# Patient Record
Sex: Male | Born: 1995 | Race: White | Hispanic: No | Marital: Single | Smoking: Former smoker
Health system: Southern US, Community
[De-identification: ages and names within clinical notes are randomized; demographics above are authoritative.]

## PROBLEM LIST (undated history)

## (undated) DIAGNOSIS — E119 Type 2 diabetes mellitus without complications: Secondary | ICD-10-CM

---
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
## 2019-04-15 ENCOUNTER — Encounter (HOSPITAL_BASED_OUTPATIENT_CLINIC_OR_DEPARTMENT_OTHER): Payer: Self-pay | Admitting: Emergency Medicine

## 2019-04-15 ENCOUNTER — Emergency Department (HOSPITAL_BASED_OUTPATIENT_CLINIC_OR_DEPARTMENT_OTHER)
Admission: EM | Admit: 2019-04-15 | Discharge: 2019-04-15 | Disposition: A | Discharge status: Home / Self Care | Payer: Self-pay | Attending: Emergency Medicine | Admitting: Emergency Medicine

## 2019-04-15 ENCOUNTER — Other Ambulatory Visit: Payer: Self-pay

## 2019-04-15 DIAGNOSIS — Z87891 Personal history of nicotine dependence: Secondary | ICD-10-CM | POA: Insufficient documentation

## 2019-04-15 DIAGNOSIS — F1210 Cannabis abuse, uncomplicated: Secondary | ICD-10-CM | POA: Insufficient documentation

## 2019-04-15 DIAGNOSIS — E119 Type 2 diabetes mellitus without complications: Secondary | ICD-10-CM | POA: Insufficient documentation

## 2019-04-15 DIAGNOSIS — K3184 Gastroparesis: Secondary | ICD-10-CM | POA: Insufficient documentation

## 2019-04-15 HISTORY — DX: Type 2 diabetes mellitus without complications: E11.9

## 2019-04-15 LAB — COMPREHENSIVE METABOLIC PANEL
ALT: 17 U/L (ref 0–44)
AST: 26 U/L (ref 15–41)
Albumin: 4.8 g/dL (ref 3.5–5.0)
Alkaline Phosphatase: 79 U/L (ref 38–126)
Anion gap: 13 (ref 5–15)
BUN: 12 mg/dL (ref 6–20)
CO2: 26 mmol/L (ref 22–32)
Calcium: 9.6 mg/dL (ref 8.9–10.3)
Chloride: 98 mmol/L (ref 98–111)
Creatinine, Ser: 0.74 mg/dL (ref 0.61–1.24)
GFR calc Af Amer: >60 mL/min (ref >60)
GFR calc non Af Amer: >60 mL/min (ref >60)
Glucose, Bld: 283 mg/dL — ABNORMAL HIGH (ref 70–99)
Potassium: 4 mmol/L (ref 3.5–5.1)
Sodium: 137 mmol/L (ref 135–145)
Total Bilirubin: 1.4 mg/dL — ABNORMAL HIGH (ref 0.3–1.2)
Total Protein: 8 g/dL (ref 6.5–8.1)

## 2019-04-15 LAB — CBG MONITORING, ED: Glucose-Capillary: 249 mg/dL — ABNORMAL HIGH (ref 70–99)

## 2019-04-15 LAB — CBC WITH DIFFERENTIAL/PLATELET
Abs Immature Granulocytes: 0.07 10*3/uL (ref 0.00–0.07)
Basophils Absolute: 0 10*3/uL (ref 0.0–0.1)
Basophils Relative: 0 %
Eosinophils Absolute: 0 10*3/uL (ref 0.0–0.5)
Eosinophils Relative: 0 %
HCT: 41.3 % (ref 39.0–52.0)
Hemoglobin: 15 g/dL (ref 13.0–17.0)
Immature Granulocytes: 0 %
Lymphocytes Relative: 6 %
Lymphs Abs: 1 10*3/uL (ref 0.7–4.0)
MCH: 30.8 pg (ref 26.0–34.0)
MCHC: 36.3 g/dL — ABNORMAL HIGH (ref 30.0–36.0)
MCV: 84.8 fL (ref 80.0–100.0)
Monocytes Absolute: 0.5 10*3/uL (ref 0.1–1.0)
Monocytes Relative: 3 %
Neutro Abs: 14.9 10*3/uL — ABNORMAL HIGH (ref 1.7–7.7)
Neutrophils Relative %: 91 %
Platelets: 132 10*3/uL — ABNORMAL LOW (ref 150–400)
RBC: 4.87 MIL/uL (ref 4.22–5.81)
RDW: 11.6 % (ref 11.5–15.5)
WBC: 16.5 10*3/uL — ABNORMAL HIGH (ref 4.0–10.5)
nRBC: 0 % (ref 0.0–0.2)

## 2019-04-15 LAB — LIPASE, BLOOD: Lipase: 15 U/L (ref 11–51)

## 2019-04-15 MED ORDER — SODIUM CHLORIDE 0.9 % IV BOLUS
1000.0000 mL | Freq: Once | INTRAVENOUS | Status: AC
  Administered 2019-04-15: 1000 mL via INTRAVENOUS

## 2019-04-15 MED ORDER — ONDANSETRON 4 MG PO TBDP: ORAL_TABLET | ORAL | 0 refills | Status: DC

## 2019-04-15 MED ORDER — DIPHENHYDRAMINE HCL 50 MG/ML IJ SOLN
25.0000 mg | Freq: Once | INTRAMUSCULAR | Status: AC
  Administered 2019-04-15: 25 mg via INTRAVENOUS
  Filled 2019-04-15: qty 1

## 2019-04-15 MED ORDER — DROPERIDOL 2.5 MG/ML IJ SOLN
1.2500 mg | Freq: Once | INTRAMUSCULAR | Status: AC
  Administered 2019-04-15: 1.25 mg via INTRAVENOUS
  Filled 2019-04-15: qty 2

## 2019-04-15 MED ORDER — PROMETHAZINE HCL 25 MG RE SUPP: 25.0000 mg | Freq: Four times a day (QID) | RECTAL | 0 refills | Status: DC | PRN

## 2019-04-15 MED FILL — PROMETHAZINE HCL 25 MG SUPP: 25 | 3 days supply | Qty: 12 | Fill #0

## 2019-04-15 MED FILL — ONDANSETRON ODT 4 MG TABLET: 4 | 3 days supply | Qty: 20 | Fill #0

## 2019-04-15 NOTE — Discharge Instructions (Signed)
Follow-up with your family doctor.  Return for abdominal pain fever or inability to eat or drink.

## 2019-04-15 NOTE — ED Triage Notes (Signed)
States has been vomiting ones a month for a year and this time he vomited blood at 4 am states has hx of gastroparisis

## 2019-04-15 NOTE — ED Provider Notes (Signed)
[facility] EMERGENCY DEPARTMENT Provider Note   CSN: [id number] Arrival date & time: 04/15/19  1318     History Chief Complaint  Patient presents with  . Vomiting    Dalton Harrison is a 24 y.o. male.  24 yo M with a cc of nausea and vomiting.  Patient has a history of gastroparesis and thinks this feels the same.  Started about 8 hours ago.  Not able to tolerate anything by mouth.  No fevers or chills no suspicious food intake no abdominal pain.  Did not try anything at home for this.  The history is provided by the patient.  Illness Severity:  Moderate Onset quality:  Gradual Duration:  8 hours Timing:  Constant Progression:  Worsening Chronicity:  New Associated symptoms: nausea and vomiting   Associated symptoms: no abdominal pain, no chest pain, no congestion, no diarrhea, no fever, no headaches, no myalgias, no rash and no shortness of breath        Past Medical History:  Diagnosis Date  . Diabetes mellitus without complication ([place])     There are no problems to display for this patient.   History reviewed. No pertinent surgical history.     No family history on file.  Social History   Tobacco Use  . Smoking status: Former Research scientist (life sciences)  . Smokeless tobacco: Former Network engineer Use Topics  . Alcohol use: Not Currently  . Drug use: Not Currently    Types: Marijuana    Home Medications Prior to Admission medications   Medication Sig Start Date End Date Taking? Authorizing Provider  ondansetron (ZOFRAN ODT) 4 MG disintegrating tablet 4mg  ODT q4 hours prn nausea/vomit 04/15/19   Deno Etienne, DO  promethazine (PHENERGAN) 25 MG suppository Place 1 suppository (25 mg total) rectally every 6 (six) hours as needed for nausea or vomiting. 04/15/19   Deno Etienne, DO    Allergies    Patient has no known allergies.  Review of Systems   Review of Systems  Constitutional: Negative for chills and fever.  HENT: Negative for congestion and facial swelling.     Eyes: Negative for discharge and visual disturbance.  Respiratory: Negative for shortness of breath.   Cardiovascular: Negative for chest pain and palpitations.  Gastrointestinal: Positive for nausea and vomiting. Negative for abdominal pain and diarrhea.  Musculoskeletal: Negative for arthralgias and myalgias.  Skin: Negative for color change and rash.  Neurological: Negative for tremors, syncope and headaches.  Psychiatric/Behavioral: Negative for confusion and dysphoric mood.    Physical Exam Updated Vital Signs BP (!) 157/110 (BP Location: Right Arm)   Pulse 100   Temp 98.8 F (37.1 C) (Oral)   Resp 18   Ht 5\' 11"  (1.803 m)   Wt 59 kg   SpO2 100%   BMI 18.13 kg/m   Physical Exam Vitals and nursing note reviewed.  Constitutional:      Appearance: He is well-developed.  HENT:     Head: Normocephalic and atraumatic.  Eyes:     Pupils: Pupils are equal, round, and reactive to light.  Neck:     Vascular: No JVD.  Cardiovascular:     Rate and Rhythm: Normal rate and regular rhythm.     Heart sounds: No murmur. No friction rub. No gallop.   Pulmonary:     Effort: No respiratory distress.     Breath sounds: No wheezing.  Abdominal:     General: There is no distension.     Tenderness: There is no  abdominal tenderness. There is no guarding or rebound.  Musculoskeletal:        General: Normal range of motion.     Cervical back: Normal range of motion and neck supple.  Skin:    Coloration: Skin is not pale.     Findings: No rash.  Neurological:     Mental Status: He is alert and oriented to person, place, and time.  Psychiatric:        Behavior: Behavior normal.     ED Results / Procedures / Treatments   Labs (all labs ordered are listed, but only abnormal results are displayed) Labs Reviewed  CBC WITH DIFFERENTIAL/PLATELET - Abnormal; Notable for the following components:      Result Value   WBC 16.5 (*)    MCHC 36.3 (*)    Platelets 132 (*)    Neutro Abs  14.9 (*)    All other components within normal limits  COMPREHENSIVE METABOLIC PANEL - Abnormal; Notable for the following components:   Glucose, Bld 283 (*)    Total Bilirubin 1.4 (*)    All other components within normal limits  CBG MONITORING, ED - Abnormal; Notable for the following components:   Glucose-Capillary 249 (*)    All other components within normal limits  LIPASE, BLOOD    EKG None  Radiology No results found.  Procedures Procedures (including critical care time)  Medications Ordered in ED Medications  droperidol (INAPSINE) 2.5 MG/ML injection 1.25 mg (1.25 mg Intravenous Given 04/15/19 1407)  diphenhydrAMINE (BENADRYL) injection 25 mg (25 mg Intravenous Given 04/15/19 1356)  sodium chloride 0.9 % bolus 1,000 mL (1,000 mLs Intravenous New Bag/Given 04/15/19 1401)    ED Course  I have reviewed the triage vital signs and the nursing notes.  Pertinent labs & imaging results that were available during my care of the patient were reviewed by me and considered in my medical decision making (see chart for details).    MDM Rules/Calculators/A&P                      24 yo M with a cc of n/v.  Occurs every month or so.  Consistent with his gastroparesis.  No abdominal pain.  Give fluids, antiemetics.  Reassess.   Patient feeling better on reassessment.  Tolerating p.o.  Discharge home.  2:48 PM:  I have discussed the diagnosis/risks/treatment options with the patient and believe the pt to be eligible for discharge home to follow-up with PCP. We also discussed returning to the ED immediately if new or worsening sx occur. We discussed the sx which are most concerning (e.g., sudden worsening pain, fever, inability to tolerate by mouth) that necessitate immediate return. Medications administered to the patient during their visit and any new prescriptions provided to the patient are listed below.  Medications given during this visit Medications  droperidol (INAPSINE) 2.5 MG/ML  injection 1.25 mg (1.25 mg Intravenous Given 04/15/19 1407)  diphenhydrAMINE (BENADRYL) injection 25 mg (25 mg Intravenous Given 04/15/19 1356)  sodium chloride 0.9 % bolus 1,000 mL (1,000 mLs Intravenous New Bag/Given 04/15/19 1401)     The patient appears reasonably screen and/or stabilized for discharge and I doubt any other medical condition or other [facility] requiring further screening, evaluation, or treatment in the ED at this time prior to discharge.   Final Clinical Impression(s) / ED Diagnoses Final diagnoses:  Gastroparesis    Rx / DC Orders ED Discharge Orders         Ordered  ondansetron (ZOFRAN ODT) 4 MG disintegrating tablet     04/15/19 1447    promethazine (PHENERGAN) 25 MG suppository  Every 6 hours PRN     04/15/19 1447           Melene Plan, DO 04/15/19 1448

## 2019-11-27 ENCOUNTER — Emergency Department (HOSPITAL_BASED_OUTPATIENT_CLINIC_OR_DEPARTMENT_OTHER): Payer: Self-pay

## 2019-11-27 ENCOUNTER — Inpatient Hospital Stay (HOSPITAL_BASED_OUTPATIENT_CLINIC_OR_DEPARTMENT_OTHER)
Admission: EM | Admit: 2019-11-27 | Discharge: 2019-11-30 | DRG: 637 | Disposition: A | Discharge status: Home / Self Care | Payer: HRSA Program | Attending: Family Medicine | Admitting: Family Medicine

## 2019-11-27 ENCOUNTER — Other Ambulatory Visit: Payer: Self-pay

## 2019-11-27 ENCOUNTER — Encounter (HOSPITAL_BASED_OUTPATIENT_CLINIC_OR_DEPARTMENT_OTHER): Payer: Self-pay | Admitting: Emergency Medicine

## 2019-11-27 DIAGNOSIS — Z794 Long term (current) use of insulin: Secondary | ICD-10-CM

## 2019-11-27 DIAGNOSIS — N179 Acute kidney failure, unspecified: Secondary | ICD-10-CM | POA: Diagnosis present

## 2019-11-27 DIAGNOSIS — E1043 Type 1 diabetes mellitus with diabetic autonomic (poly)neuropathy: Secondary | ICD-10-CM | POA: Diagnosis present

## 2019-11-27 DIAGNOSIS — R6510 Systemic inflammatory response syndrome (SIRS) of non-infectious origin without acute organ dysfunction: Secondary | ICD-10-CM | POA: Diagnosis present

## 2019-11-27 DIAGNOSIS — Z87891 Personal history of nicotine dependence: Secondary | ICD-10-CM

## 2019-11-27 DIAGNOSIS — E1010 Type 1 diabetes mellitus with ketoacidosis without coma: Principal | ICD-10-CM | POA: Diagnosis present

## 2019-11-27 DIAGNOSIS — U071 COVID-19: Secondary | ICD-10-CM | POA: Diagnosis present

## 2019-11-27 DIAGNOSIS — R4781 Slurred speech: Secondary | ICD-10-CM | POA: Diagnosis present

## 2019-11-27 DIAGNOSIS — G92 Toxic encephalopathy: Secondary | ICD-10-CM | POA: Diagnosis present

## 2019-11-27 DIAGNOSIS — E10649 Type 1 diabetes mellitus with hypoglycemia without coma: Secondary | ICD-10-CM | POA: Diagnosis present

## 2019-11-27 DIAGNOSIS — E1110 Type 2 diabetes mellitus with ketoacidosis without coma: Secondary | ICD-10-CM | POA: Diagnosis present

## 2019-11-27 DIAGNOSIS — Y658 Other specified misadventures during surgical and medical care: Secondary | ICD-10-CM | POA: Diagnosis present

## 2019-11-27 DIAGNOSIS — K3184 Gastroparesis: Secondary | ICD-10-CM | POA: Diagnosis present

## 2019-11-27 DIAGNOSIS — E875 Hyperkalemia: Secondary | ICD-10-CM | POA: Diagnosis present

## 2019-11-27 DIAGNOSIS — F1290 Cannabis use, unspecified, uncomplicated: Secondary | ICD-10-CM | POA: Diagnosis present

## 2019-11-27 LAB — URINALYSIS, ROUTINE W REFLEX MICROSCOPIC
Bilirubin Urine: NEGATIVE
Glucose, UA: >=500 mg/dL — AB
Hgb urine dipstick: NEGATIVE
Ketones, ur: 40 mg/dL — AB
Leukocytes,Ua: NEGATIVE
Nitrite: NEGATIVE
Protein, ur: NEGATIVE mg/dL
Specific Gravity, Urine: 1.015 (ref 1.005–1.030)
pH: 5.5 (ref 5.0–8.0)

## 2019-11-27 LAB — I-STAT VENOUS BLOOD GAS, ED
Acid-base deficit: 21 mmol/L — ABNORMAL HIGH (ref 0.0–2.0)
Bicarbonate: 6.6 mmol/L — ABNORMAL LOW (ref 20.0–28.0)
Calcium, Ion: 0.86 mmol/L — CL (ref 1.15–1.40)
HCT: 46 % (ref 39.0–52.0)
Hemoglobin: 15.6 g/dL (ref 13.0–17.0)
O2 Saturation: 94 %
Patient temperature: 92.5
Potassium: 5.7 mmol/L — ABNORMAL HIGH (ref 3.5–5.1)
Sodium: 123 mmol/L — ABNORMAL LOW (ref 135–145)
TCO2: 7 mmol/L — ABNORMAL LOW (ref 22–32)
pCO2, Ven: 17.5 mmHg — CL (ref 44.0–60.0)
pH, Ven: 7.166 — CL (ref 7.250–7.430)
pO2, Ven: 73 mmHg — ABNORMAL HIGH (ref 32.0–45.0)

## 2019-11-27 LAB — CBC WITH DIFFERENTIAL/PLATELET
Abs Immature Granulocytes: 0.3 10*3/uL — ABNORMAL HIGH (ref 0.00–0.07)
Band Neutrophils: 7 %
Basophils Absolute: 0 10*3/uL (ref 0.0–0.1)
Basophils Relative: 0 %
Eosinophils Absolute: 0 10*3/uL (ref 0.0–0.5)
Eosinophils Relative: 0 %
HCT: 50.2 % (ref 39.0–52.0)
Hemoglobin: 14.1 g/dL (ref 13.0–17.0)
Lymphocytes Relative: 11 %
Lymphs Abs: 3.4 10*3/uL (ref 0.7–4.0)
MCH: 30.3 pg (ref 26.0–34.0)
MCHC: 28.1 g/dL — ABNORMAL LOW (ref 30.0–36.0)
MCV: 107.7 fL — ABNORMAL HIGH (ref 80.0–100.0)
Monocytes Absolute: 0.3 10*3/uL (ref 0.1–1.0)
Monocytes Relative: 1 %
Myelocytes: 1 %
Neutro Abs: 26.9 10*3/uL — ABNORMAL HIGH (ref 1.7–7.7)
Neutrophils Relative %: 80 %
Platelets: 574 10*3/uL — ABNORMAL HIGH (ref 150–400)
RBC: 4.66 MIL/uL (ref 4.22–5.81)
RDW: 11.9 % (ref 11.5–15.5)
Smear Review: NORMAL
WBC: 30.9 10*3/uL — ABNORMAL HIGH (ref 4.0–10.5)
nRBC: 0 % (ref 0.0–0.2)

## 2019-11-27 LAB — URINALYSIS, MICROSCOPIC (REFLEX)

## 2019-11-27 LAB — HEPATIC FUNCTION PANEL
ALT: 34 U/L (ref 0–44)
AST: 39 U/L (ref 15–41)
Albumin: 4.1 g/dL (ref 3.5–5.0)
Alkaline Phosphatase: 134 U/L — ABNORMAL HIGH (ref 38–126)
Bilirubin, Direct: 0.1 mg/dL (ref 0.0–0.2)
Indirect Bilirubin: 1.6 mg/dL — ABNORMAL HIGH (ref 0.3–0.9)
Total Bilirubin: 1.7 mg/dL — ABNORMAL HIGH (ref 0.3–1.2)
Total Protein: 6.3 g/dL — ABNORMAL LOW (ref 6.5–8.1)

## 2019-11-27 LAB — BASIC METABOLIC PANEL
Anion gap: 38 — ABNORMAL HIGH (ref 5–15)
BUN: 82 mg/dL — ABNORMAL HIGH (ref 6–20)
CO2: 7 mmol/L — ABNORMAL LOW (ref 22–32)
Calcium: 7.4 mg/dL — ABNORMAL LOW (ref 8.9–10.3)
Chloride: 80 mmol/L — ABNORMAL LOW (ref 98–111)
Creatinine, Ser: 4.41 mg/dL — ABNORMAL HIGH (ref 0.61–1.24)
GFR calc Af Amer: 20 mL/min — ABNORMAL LOW (ref >60)
GFR calc non Af Amer: 18 mL/min — ABNORMAL LOW (ref >60)
Glucose, Bld: 1743 mg/dL (ref 70–99)
Potassium: 6 mmol/L — ABNORMAL HIGH (ref 3.5–5.1)
Sodium: 125 mmol/L — ABNORMAL LOW (ref 135–145)

## 2019-11-27 LAB — RAPID URINE DRUG SCREEN, HOSP PERFORMED
Amphetamines: NOT DETECTED
Barbiturates: NOT DETECTED
Benzodiazepines: NOT DETECTED
Cocaine: NOT DETECTED
Opiates: NOT DETECTED
Tetrahydrocannabinol: POSITIVE — AB

## 2019-11-27 LAB — SARS CORONAVIRUS 2 BY RT PCR (HOSPITAL ORDER, PERFORMED IN ~~LOC~~ HOSPITAL LAB): SARS Coronavirus 2: POSITIVE — AB

## 2019-11-27 LAB — CBG MONITORING, ED
Glucose-Capillary: >600 mg/dL (ref 70–99)
Glucose-Capillary: >600 mg/dL (ref 70–99)

## 2019-11-27 LAB — LACTIC ACID, PLASMA: Lactic Acid, Venous: 4 mmol/L (ref 0.5–1.9)

## 2019-11-27 LAB — ETHANOL: Alcohol, Ethyl (B): <10 mg/dL (ref <10)

## 2019-11-27 LAB — LIPASE, BLOOD: Lipase: 20 U/L (ref 11–51)

## 2019-11-27 MED ORDER — INSULIN REGULAR(HUMAN) IN NACL 100-0.9 UT/100ML-% IV SOLN
INTRAVENOUS | Status: DC
  Administered 2019-11-27: 5.5 [IU]/h via INTRAVENOUS
  Filled 2019-11-27: qty 100

## 2019-11-27 MED ORDER — SODIUM CHLORIDE 0.9 % IV SOLN
2.0000 g | Freq: Once | INTRAVENOUS | Status: AC
  Administered 2019-11-27: 2 g via INTRAVENOUS
  Filled 2019-11-27: qty 2

## 2019-11-27 MED ORDER — LACTATED RINGERS IV SOLN: INTRAVENOUS | Status: DC

## 2019-11-27 MED ORDER — METRONIDAZOLE IN NACL 5-0.79 MG/ML-% IV SOLN
500.0000 mg | Freq: Once | INTRAVENOUS | Status: AC
  Administered 2019-11-27: 500 mg via INTRAVENOUS
  Filled 2019-11-27: qty 100

## 2019-11-27 MED ORDER — VANCOMYCIN HCL 500 MG/100ML IV SOLN
500.0000 mg | INTRAVENOUS | Status: DC
  Filled 2019-11-27: qty 100

## 2019-11-27 MED ORDER — LACTATED RINGERS IV BOLUS: Freq: Once | INTRAVENOUS | Status: AC

## 2019-11-27 MED ORDER — DEXTROSE 50 % IV SOLN
0.0000 mL | INTRAVENOUS | Status: DC | PRN
  Administered 2019-11-28 – 2019-11-29 (×3): 25 mL via INTRAVENOUS
  Administered 2019-11-29: 50 mL via INTRAVENOUS
  Filled 2019-11-27 (×2): qty 50

## 2019-11-27 MED ORDER — DEXTROSE IN LACTATED RINGERS 5 % IV SOLN: INTRAVENOUS | Status: DC

## 2019-11-27 MED ORDER — SODIUM BICARBONATE 8.4 % IV SOLN
50.0000 meq | Freq: Once | INTRAVENOUS | Status: AC
  Administered 2019-11-27: 50 meq via INTRAVENOUS
  Filled 2019-11-27: qty 50

## 2019-11-27 MED ORDER — SODIUM CHLORIDE 0.9 % IV SOLN: 2.0000 g | INTRAVENOUS | Status: DC

## 2019-11-27 MED ORDER — LORAZEPAM 2 MG/ML IJ SOLN
1.0000 mg | Freq: Once | INTRAMUSCULAR | Status: AC
  Administered 2019-11-27: 1 mg via INTRAVENOUS
  Filled 2019-11-27: qty 1

## 2019-11-27 MED ORDER — LORAZEPAM 2 MG/ML IJ SOLN
INTRAMUSCULAR | Status: AC
  Administered 2019-11-27: 2 mg
  Filled 2019-11-27: qty 1

## 2019-11-27 MED ORDER — SODIUM CHLORIDE 0.9 % IV BOLUS
2000.0000 mL | Freq: Once | INTRAVENOUS | Status: AC
  Administered 2019-11-27: 2000 mL via INTRAVENOUS

## 2019-11-27 MED ORDER — VANCOMYCIN HCL IN DEXTROSE 1-5 GM/200ML-% IV SOLN
1000.0000 mg | Freq: Once | INTRAVENOUS | Status: AC
  Administered 2019-11-27: 1000 mg via INTRAVENOUS
  Filled 2019-11-27: qty 200

## 2019-11-27 MED ORDER — LORAZEPAM 2 MG/ML IJ SOLN: 1.0000 mg | Freq: Once | INTRAMUSCULAR | Status: AC

## 2019-11-27 MED ORDER — LORAZEPAM 2 MG/ML IJ SOLN
INTRAMUSCULAR | Status: AC
  Administered 2019-11-27: 1 mg
  Filled 2019-11-27: qty 1

## 2019-11-27 NOTE — ED Notes (Addendum)
RASS +2/+3

## 2019-11-27 NOTE — ED Notes (Signed)
[facility] notifide Marijean Niemann) - ready for transport

## 2019-11-27 NOTE — ED Notes (Signed)
Appearing less restless at this time, post Ativan IV administration

## 2019-11-27 NOTE — Progress Notes (Signed)
Pharmacy Antibiotic Note  Dalton Harrison is a 24 y.o. male admitted on 11/27/2019 with sepsis.  Pharmacy has been consulted for vancomycin and Zosyn dosing. AKI noted, SCr 4.41 (was 0.74 04/15/19), norm CrCl ~26 ml/min. Also with DKA. Temperature is low, lactic acid is 4.  Plan: Vancomycin 1000 mg IV load then 500 mg IV q24h Cefepime 2 g IV q24h Monitor renal function, clinical progress, cultures/sensitivities F/U LOT and de-escalate as able Vancomycin trough as clinically indicated      Temp (24hrs), Avg:92.5 F (33.6 C), Min:92.5 F (33.6 C), Max:92.5 F (33.6 C)  Recent Labs  Lab 11/27/19 2141  CREATININE 4.41*  LATICACIDVEN 4.0*    CrCl cannot be calculated (Unknown ideal weight.).    No Known Allergies  Antimicrobials this admission: vancomycin 9/19 >>  cefepime 9/19 >>   Dose adjustments this admission:   Microbiology results: 9/19 BCx: pending   Thank you for involving pharmacy in this patient's care.  Loura Back, PharmD, BCPS Clinical Pharmacist 11/27/2019 10:30 PM  **Pharmacist phone directory can be found on [email or web address] listed under [facility]**

## 2019-11-27 NOTE — ED Triage Notes (Signed)
BIB by dad, after friends alerted him that he was "talking out of his head". Father reports dx with DM at age 24. Pt is very altered, difficult to follow commands, slurred speech, pinpoint pupils.   CBG reads HIGH

## 2019-11-27 NOTE — ED Notes (Signed)
Father updated on current pt status, informed of Positive Covid results, current plan of care and admission information in regards to admission to ICU unit at [facility].

## 2019-11-27 NOTE — ED Notes (Signed)
Pt's blood sugar of 1,743 and lactic acid of 4.0 reported to dr. Jacqulyn Bath

## 2019-11-27 NOTE — ED Provider Notes (Signed)
Emergency Department Provider Note   I have reviewed the triage vital signs and the nursing notes.   HISTORY  Chief Complaint Altered Mental Status   HPI Dalton Harrison is a 24 y.o. male patient with known history of insulin-dependent diabetes arrives by private vehicle with dad with patient being acutely altered.  Dad states that normally patient is compliant with his insulin but that has been spending a lot of time with his friends and that he was ultimately called by them today saying that the patient has been "talking out of his head" and was very fatigued not getting out of bed.  There was no known head injury.  Unknown substance use.  When dad saw the condition that he was and he presents immediately to the ED.   Level 5 caveat: AMS   Past Medical History:  Diagnosis Date   Diabetes mellitus without complication [facility](HCC)     Patient Active Problem List   Diagnosis Date Noted   DKA (diabetic ketoacidoses) (HCC) 11/27/2019    History reviewed. No pertinent surgical history.  Allergies Patient has no known allergies.  History reviewed. No pertinent family history.  Social History Social History   Tobacco Use   Smoking status: Former Smoker   Smokeless tobacco: Former Forensic psychologistUser  Vaping Use   Vaping Use: Former  Substance Use Topics   Alcohol use: Not Currently   Drug use: Not Currently    Types: Marijuana    Review of Systems  Level 5 caveat: AMS   ____________________________________________   PHYSICAL EXAM:  VITAL SIGNS: Vitals:   11/28/19 1300 11/28/19 1400  BP: 104/61 (!) 89/58  Pulse: 70 72  Resp: (!) 27 (!) 27  Temp: (!) 95.5 F (35.3 C) (!) 96.3 F (35.7 C)  SpO2: 95% 94%    Constitutional: Drowsy but arouses to voice and gentle touch but not providing a useful history.  Eyes: Conjunctivae are normal. Pupils are 3 mm and reactive bilaterally.  Head: Atraumatic. Nose: No congestion/rhinnorhea. Mouth/Throat: Mucous membranes are very  dry.  Neck: No stridor.   Cardiovascular: Normal rate, regular rhythm. Good peripheral circulation. Grossly normal heart sounds.   Respiratory: Normal respiratory effort.  No retractions. Lungs CTAB. Gastrointestinal: Soft and nontender. No distention.  Musculoskeletal: No lower extremity tenderness nor edema. No gross deformities of extremities. Neurologic:  Moaning and answering simple questions such as name. Moving all extremities but having difficulty following with exam.  Skin:  Skin is warm, dry and intact. No rash noted.  ____________________________________________   LABS (all labs ordered are listed, but only abnormal results are displayed)  Labs Reviewed  SARS CORONAVIRUS 2 BY RT PCR (HOSPITAL ORDER, PERFORMED IN [facility]) - Abnormal; Notable for the following components:      Result Value   SARS Coronavirus 2 POSITIVE (*)    All other components within normal limits  BASIC METABOLIC PANEL - Abnormal; Notable for the following components:   Sodium 125 (*)    Potassium 6.0 (*)    Chloride 80 (*)    CO2 7 (*)    Glucose, Bld 1,743 (*)    BUN 82 (*)    Creatinine, Ser 4.41 (*)    Calcium 7.4 (*)    GFR calc non Af Amer 18 (*)    GFR calc Af Amer 20 (*)    Anion gap 38 (*)    All other components within normal limits  BETA-HYDROXYBUTYRIC ACID - Abnormal; Notable for the following components:  Beta-Hydroxybutyric Acid >8.00 (*)    All other components within normal limits  CBC WITH DIFFERENTIAL/PLATELET - Abnormal; Notable for the following components:   WBC 30.9 (*)    MCV 107.7 (*)    MCHC 28.1 (*)    Platelets 574 (*)    Neutro Abs 26.9 (*)    Abs Immature Granulocytes 0.30 (*)    All other components within normal limits  URINALYSIS, ROUTINE W REFLEX MICROSCOPIC - Abnormal; Notable for the following components:   Glucose, UA >=500 (*)    Ketones, ur 40 (*)    All other components within normal limits  RAPID URINE DRUG SCREEN, HOSP PERFORMED -  Abnormal; Notable for the following components:   Tetrahydrocannabinol POSITIVE (*)    All other components within normal limits  LACTIC ACID, PLASMA - Abnormal; Notable for the following components:   Lactic Acid, Venous 4.0 (*)    All other components within normal limits  HEPATIC FUNCTION PANEL - Abnormal; Notable for the following components:   Total Protein 6.3 (*)    Alkaline Phosphatase 134 (*)    Total Bilirubin 1.7 (*)    Indirect Bilirubin 1.6 (*)    All other components within normal limits  URINALYSIS, MICROSCOPIC (REFLEX) - Abnormal; Notable for the following components:   Bacteria, UA FEW (*)    All other components within normal limits  GLUCOSE, CAPILLARY - Abnormal; Notable for the following components:   Glucose-Capillary >600 (*)    All other components within normal limits  FERRITIN - Abnormal; Notable for the following components:   Ferritin 567 (*)    All other components within normal limits  D-DIMER, QUANTITATIVE (NOT AT [facility]) - Abnormal; Notable for the following components:   D-Dimer, Quant 0.83 (*)    All other components within normal limits  GLUCOSE, CAPILLARY - Abnormal; Notable for the following components:   Glucose-Capillary >600 (*)    All other components within normal limits  GLUCOSE, CAPILLARY - Abnormal; Notable for the following components:   Glucose-Capillary >600 (*)    All other components within normal limits  LACTIC ACID, PLASMA - Abnormal; Notable for the following components:   Lactic Acid, Venous 2.2 (*)    All other components within normal limits  GLUCOSE, CAPILLARY - Abnormal; Notable for the following components:   Glucose-Capillary >600 (*)    All other components within normal limits  GLUCOSE, CAPILLARY - Abnormal; Notable for the following components:   Glucose-Capillary >600 (*)    All other components within normal limits  GLUCOSE, CAPILLARY - Abnormal; Notable for the following components:   Glucose-Capillary >600 (*)     All other components within normal limits  GLUCOSE, CAPILLARY - Abnormal; Notable for the following components:   Glucose-Capillary >600 (*)    All other components within normal limits  BASIC METABOLIC PANEL - Abnormal; Notable for the following components:   Chloride 97 (*)    CO2 15 (*)    Glucose, Bld 1,144 (*)    BUN 79 (*)    Creatinine, Ser 4.79 (*)    Calcium 7.0 (*)    GFR calc non Af Amer 16 (*)    GFR calc Af Amer 19 (*)    Anion gap 23 (*)    All other components within normal limits  GLUCOSE, CAPILLARY - Abnormal; Notable for the following components:   Glucose-Capillary >600 (*)    All other components within normal limits  GLUCOSE, CAPILLARY - Abnormal; Notable for the following components:  Glucose-Capillary >600 (*)    All other components within normal limits  GLUCOSE, CAPILLARY - Abnormal; Notable for the following components:   Glucose-Capillary 481 (*)    All other components within normal limits  GLUCOSE, CAPILLARY - Abnormal; Notable for the following components:   Glucose-Capillary 456 (*)    All other components within normal limits  GLUCOSE, CAPILLARY - Abnormal; Notable for the following components:   Glucose-Capillary 408 (*)    All other components within normal limits  GLUCOSE, CAPILLARY - Abnormal; Notable for the following components:   Glucose-Capillary 365 (*)    All other components within normal limits  BASIC METABOLIC PANEL - Abnormal; Notable for the following components:   Sodium 151 (*)    Glucose, Bld 201 (*)    BUN 64 (*)    Creatinine, Ser 3.31 (*)    Calcium 7.4 (*)    GFR calc non Af Amer 25 (*)    GFR calc Af Amer 29 (*)    All other components within normal limits  GLUCOSE, CAPILLARY - Abnormal; Notable for the following components:   Glucose-Capillary 245 (*)    All other components within normal limits  GLUCOSE, CAPILLARY - Abnormal; Notable for the following components:   Glucose-Capillary 178 (*)    All other  components within normal limits  GLUCOSE, CAPILLARY - Abnormal; Notable for the following components:   Glucose-Capillary 135 (*)    All other components within normal limits  GLUCOSE, CAPILLARY - Abnormal; Notable for the following components:   Glucose-Capillary 156 (*)    All other components within normal limits  CBG MONITORING, ED - Abnormal; Notable for the following components:   Glucose-Capillary >600 (*)    All other components within normal limits  I-STAT VENOUS BLOOD GAS, ED - Abnormal; Notable for the following components:   pH, Ven 7.166 (*)    pCO2, Ven 17.5 (*)    pO2, Ven 73.0 (*)    Bicarbonate 6.6 (*)    TCO2 7 (*)    Acid-base deficit 21.0 (*)    Sodium 123 (*)    Potassium 5.7 (*)    Calcium, Ion 0.86 (*)    All other components within normal limits  CBG MONITORING, ED - Abnormal; Notable for the following components:   Glucose-Capillary >600 (*)    All other components within normal limits  CBG MONITORING, ED - Abnormal; Notable for the following components:   Glucose-Capillary >600 (*)    All other components within normal limits  MRSA PCR SCREENING  CULTURE, BLOOD (ROUTINE X 2)  CULTURE, BLOOD (ROUTINE X 2)  ETHANOL  LIPASE, BLOOD  BLOOD GAS, VENOUS   ____________________________________________  EKG   EKG Interpretation  Date/Time:  Sunday November 27 2019 22:23:37 EDT Ventricular Rate:  96 PR Interval:    QRS Duration: 110 QT Interval:  385 QTC Calculation: 487 R Axis:   97 Text Interpretation: Sinus rhythm Ventricular premature complex Right atrial enlargement Consider right ventricular hypertrophy Borderline prolonged QT interval No STEMI Confirmed by Alona Bene [id number]) on 11/27/2019 10:32:41 PM       ____________________________________________  RADIOLOGY  CT Head Wo Contrast  Result Date: 11/27/2019 CLINICAL DATA:  Slurred speech. EXAM: CT HEAD WITHOUT CONTRAST TECHNIQUE: Contiguous axial images were obtained from the base of  the skull through the vertex without intravenous contrast. COMPARISON:  None. FINDINGS: Brain: No evidence of acute infarction, hemorrhage, hydrocephalus, extra-axial collection or mass lesion/mass effect. Vascular: No hyperdense vessel or unexpected calcification. Skull: Normal.  Negative for fracture or focal lesion. Sinuses/Orbits: Very mild bilateral ethmoid sinus mucosal thickening is seen. Other: None. IMPRESSION: 1. No acute intracranial abnormality. 2. Very mild bilateral ethmoid sinus mucosal thickening. Electronically Signed   By: Aram Candela M.D.   On: 11/27/2019 22:10   DG Chest Portable 1 View  Result Date: 11/27/2019 CLINICAL DATA:  Slurred speech. EXAM: PORTABLE CHEST 1 VIEW COMPARISON:  None. FINDINGS: There is no evidence of acute infiltrate, pleural effusion or pneumothorax. The heart size and mediastinal contours are within normal limits. The visualized skeletal structures are unremarkable. IMPRESSION: No active cardiopulmonary disease. Electronically Signed   By: Aram Candela M.D.   On: 11/27/2019 22:08    ____________________________________________   PROCEDURES  Procedure(s) performed:   Procedures  CRITICAL CARE Performed by: Maia Plan Total critical care time: 75 minutes Critical care time was exclusive of separately billable procedures and treating other patients. Critical care was necessary to treat or prevent imminent or life-threatening deterioration. Critical care was time spent personally by me on the following activities: development of treatment plan with patient and/or surrogate as well as nursing, discussions with consultants, evaluation of patient's response to treatment, examination of patient, obtaining history from patient or surrogate, ordering and performing treatments and interventions, ordering and review of laboratory studies, ordering and review of radiographic studies, pulse oximetry and re-evaluation of patient's condition.  Alona Bene, MD Emergency Medicine  ____________________________________________   INITIAL IMPRESSION / ASSESSMENT AND PLAN / ED COURSE  Pertinent labs & imaging results that were available during my care of the patient were reviewed by me and considered in my medical decision making (see chart for details).   Patient arrives to the emergency department altered and hypothermic with rectal temp of 92.  He is hypotensive on arrival and CBG on arrival reads "high."  I do not see any outward sign of head trauma.  I have added on ethanol as well as UDS but suspect DKA clinically. Will also add blood cultures and lactate with hypothermia. 2L IVF bolus ordered and will wait on chemistry to initiate insulin.   10:33 PM  Patient with improving blood pressure after IV fluid boluses. We will continue aggressive IV fluid hydration as the patient's blood sugar is over 1700 with associated acute renal failure and hyperkalemia. He has a severely low bicarb. Plan for bicarb push along with insulin drip and continued aggressive IV fluids. Mental status remains borderline but he is protecting his airway and not hypoxemic. He does not require intubation at this time. Will reach out to critical care discussed the case and and assign an ICU bed. CT head is negative.   Discussed patient's case with ICU to request admission. Patient and family (if present) updated with plan. Care transferred to ICU service.  I reviewed all nursing notes, vitals, pertinent old records, EKGs, labs, imaging (as available).  ____________________________________________  FINAL CLINICAL IMPRESSION(S) / ED DIAGNOSES  Final diagnoses:  Diabetic ketoacidosis without coma associated with type 1 diabetes mellitus (HCC)  AKI (acute kidney injury) (HCC)  Hyperkalemia     MEDICATIONS GIVEN DURING THIS VISIT:  Medications  insulin regular, human (MYXREDLIN) 100 units/ 100 mL infusion ( Intravenous Rate/Dose Verify 11/28/19 1500)  lactated  ringers infusion ( Intravenous Not Given 11/28/19 0200)  dextrose 5 % in lactated ringers infusion ( Intravenous Rate/Dose Verify 11/28/19 1500)  dextrose 50 % solution 0-50 mL (has no administration in time range)  dexmedetomidine (PRECEDEX) 400 MCG/100ML (4 mcg/mL) infusion (0.9 mcg/kg/hr  59 kg Intravenous Rate/Dose Verify 11/28/19 1500)  remdesivir 100 mg in sodium chloride 0.9 % 100 mL IVPB (has no administration in time range)  Chlorhexidine Gluconate Cloth 2 % PADS 6 each (has no administration in time range)  heparin injection 5,000 Units (5,000 Units Subcutaneous Given 11/28/19 1302)  sodium chloride 0.9 % bolus 2,000 mL (0 mLs Intravenous Stopped 11/27/19 2306)  ceFEPIme (MAXIPIME) 2 g in sodium chloride 0.9 % 100 mL IVPB (0 g Intravenous Stopped 11/27/19 2308)  metroNIDAZOLE (FLAGYL) IVPB 500 mg (0 mg Intravenous Stopped 11/27/19 2319)  vancomycin (VANCOCIN) IVPB 1000 mg/200 mL premix (0 mg Intravenous Stopped 11/28/19 0037)  lactated ringers bolus (0 mLs Intravenous Stopped 11/27/19 2348)  sodium bicarbonate injection 50 mEq (50 mEq Intravenous Given 11/27/19 2241)  LORazepam (ATIVAN) injection 1 mg (2 mg Intravenous Given 11/27/19 2203)  LORazepam (ATIVAN) injection 1 mg (1 mg Intravenous Given 11/27/19 2343)  LORazepam (ATIVAN) 2 MG/ML injection (1 mg  Given 11/27/19 2304)  LORazepam (ATIVAN) 2 MG/ML injection (  Given by Other 11/28/19 0944)  LORazepam (ATIVAN) injection 1 mg (1 mg Intravenous Given 11/28/19 0117)  remdesivir 200 mg in sodium chloride 0.9% 250 mL IVPB ( Intravenous Stopped 11/28/19 0442)  calcium gluconate 1 g/ 50 mL sodium chloride IVPB ( Intravenous Stopped 11/28/19 0554)  lactated ringers bolus 1,000 mL (1,000 mLs Intravenous New Bag/Given 11/28/19 0405)  lactated ringers bolus 1,000 mL (1,000 mLs Intravenous New Bag/Given 11/28/19 1033)  lactated ringers bolus 1,000 mL (1,000 mLs Intravenous New Bag/Given 11/28/19 0924)    Note:  This document was prepared using Dragon voice  recognition software and may include unintentional dictation errors.  Alona Bene, MD, [facility] Emergency Medicine    Leib Elahi, Arlyss Repress, MD 11/28/19 [phone]

## 2019-11-28 ENCOUNTER — Inpatient Hospital Stay (HOSPITAL_COMMUNITY): Payer: Self-pay

## 2019-11-28 DIAGNOSIS — E1011 Type 1 diabetes mellitus with ketoacidosis with coma: Secondary | ICD-10-CM | POA: Diagnosis not present

## 2019-11-28 DIAGNOSIS — U071 COVID-19: Secondary | ICD-10-CM | POA: Diagnosis not present

## 2019-11-28 DIAGNOSIS — R4182 Altered mental status, unspecified: Secondary | ICD-10-CM

## 2019-11-28 DIAGNOSIS — N179 Acute kidney failure, unspecified: Secondary | ICD-10-CM

## 2019-11-28 DIAGNOSIS — G9340 Encephalopathy, unspecified: Secondary | ICD-10-CM | POA: Diagnosis not present

## 2019-11-28 LAB — BASIC METABOLIC PANEL
Anion gap: 23 — ABNORMAL HIGH (ref 5–15)
Anion gap: 13 (ref 5–15)
BUN: 79 mg/dL — ABNORMAL HIGH (ref 6–20)
BUN: 64 mg/dL — ABNORMAL HIGH (ref 6–20)
CO2: 15 mmol/L — ABNORMAL LOW (ref 22–32)
CO2: 28 mmol/L (ref 22–32)
Calcium: 7 mg/dL — ABNORMAL LOW (ref 8.9–10.3)
Calcium: 7.4 mg/dL — ABNORMAL LOW (ref 8.9–10.3)
Chloride: 97 mmol/L — ABNORMAL LOW (ref 98–111)
Chloride: 110 mmol/L (ref 98–111)
Creatinine, Ser: 4.79 mg/dL — ABNORMAL HIGH (ref 0.61–1.24)
Creatinine, Ser: 3.31 mg/dL — ABNORMAL HIGH (ref 0.61–1.24)
GFR calc Af Amer: 19 mL/min — ABNORMAL LOW (ref >60)
GFR calc Af Amer: 29 mL/min — ABNORMAL LOW (ref >60)
GFR calc non Af Amer: 16 mL/min — ABNORMAL LOW (ref >60)
GFR calc non Af Amer: 25 mL/min — ABNORMAL LOW (ref >60)
Glucose, Bld: 1144 mg/dL (ref 70–99)
Glucose, Bld: 201 mg/dL — ABNORMAL HIGH (ref 70–99)
Potassium: 3.9 mmol/L (ref 3.5–5.1)
Potassium: 4 mmol/L (ref 3.5–5.1)
Sodium: 135 mmol/L (ref 135–145)
Sodium: 151 mmol/L — ABNORMAL HIGH (ref 135–145)

## 2019-11-28 LAB — GLUCOSE, CAPILLARY
Glucose-Capillary: 135 mg/dL — ABNORMAL HIGH (ref 70–99)
Glucose-Capillary: 150 mg/dL — ABNORMAL HIGH (ref 70–99)
Glucose-Capillary: 151 mg/dL — ABNORMAL HIGH (ref 70–99)
Glucose-Capillary: 156 mg/dL — ABNORMAL HIGH (ref 70–99)
Glucose-Capillary: 173 mg/dL — ABNORMAL HIGH (ref 70–99)
Glucose-Capillary: 177 mg/dL — ABNORMAL HIGH (ref 70–99)
Glucose-Capillary: 178 mg/dL — ABNORMAL HIGH (ref 70–99)
Glucose-Capillary: 190 mg/dL — ABNORMAL HIGH (ref 70–99)
Glucose-Capillary: 245 mg/dL — ABNORMAL HIGH (ref 70–99)
Glucose-Capillary: 365 mg/dL — ABNORMAL HIGH (ref 70–99)
Glucose-Capillary: 408 mg/dL — ABNORMAL HIGH (ref 70–99)
Glucose-Capillary: 42 mg/dL — CL (ref 70–99)
Glucose-Capillary: 456 mg/dL — ABNORMAL HIGH (ref 70–99)
Glucose-Capillary: 481 mg/dL — ABNORMAL HIGH (ref 70–99)
Glucose-Capillary: >600 mg/dL (ref 70–99)
Glucose-Capillary: >600 mg/dL (ref 70–99)
Glucose-Capillary: >600 mg/dL (ref 70–99)
Glucose-Capillary: >600 mg/dL (ref 70–99)
Glucose-Capillary: >600 mg/dL (ref 70–99)
Glucose-Capillary: >600 mg/dL (ref 70–99)
Glucose-Capillary: >600 mg/dL (ref 70–99)
Glucose-Capillary: >600 mg/dL (ref 70–99)
Glucose-Capillary: >600 mg/dL (ref 70–99)

## 2019-11-28 LAB — MRSA PCR SCREENING: MRSA by PCR: NEGATIVE

## 2019-11-28 LAB — D-DIMER, QUANTITATIVE: D-Dimer, Quant: 0.83 ug/mL-FEU — ABNORMAL HIGH (ref 0.00–0.50)

## 2019-11-28 LAB — CBG MONITORING, ED: Glucose-Capillary: >600 mg/dL (ref 70–99)

## 2019-11-28 LAB — BETA-HYDROXYBUTYRIC ACID: Beta-Hydroxybutyric Acid: >8 mmol/L — ABNORMAL HIGH (ref 0.05–0.27)

## 2019-11-28 LAB — FERRITIN: Ferritin: 567 ng/mL — ABNORMAL HIGH (ref 24–336)

## 2019-11-28 LAB — LACTIC ACID, PLASMA: Lactic Acid, Venous: 2.2 mmol/L (ref 0.5–1.9)

## 2019-11-28 MED ORDER — ENOXAPARIN SODIUM 40 MG/0.4ML ~~LOC~~ SOLN: 40.0000 mg | SUBCUTANEOUS | Status: DC

## 2019-11-28 MED ORDER — SODIUM CHLORIDE 0.9 % IV SOLN
200.0000 mg | Freq: Once | INTRAVENOUS | Status: AC
  Administered 2019-11-28: 200 mg via INTRAVENOUS
  Filled 2019-11-28: qty 40

## 2019-11-28 MED ORDER — SODIUM CHLORIDE 0.9 % IV SOLN
100.0000 mg | Freq: Every day | INTRAVENOUS | Status: DC
  Administered 2019-11-29 – 2019-11-30 (×2): 100 mg via INTRAVENOUS
  Filled 2019-11-28 (×2): qty 20

## 2019-11-28 MED ORDER — CHLORHEXIDINE GLUCONATE CLOTH 2 % EX PADS
6.0000 | MEDICATED_PAD | Freq: Every day | CUTANEOUS | Status: DC
  Administered 2019-11-29 – 2019-11-30 (×3): 6 via TOPICAL

## 2019-11-28 MED ORDER — CALCIUM GLUCONATE-NACL 1-0.675 GM/50ML-% IV SOLN
1.0000 g | Freq: Once | INTRAVENOUS | Status: AC
  Administered 2019-11-28: 1000 mg via INTRAVENOUS
  Filled 2019-11-28: qty 50

## 2019-11-28 MED ORDER — LORAZEPAM 2 MG/ML IJ SOLN
INTRAMUSCULAR | Status: AC
  Filled 2019-11-28: qty 1

## 2019-11-28 MED ORDER — INSULIN ASPART 100 UNIT/ML ~~LOC~~ SOLN
2.0000 [IU] | SUBCUTANEOUS | Status: DC
  Administered 2019-11-28: 4 [IU] via SUBCUTANEOUS

## 2019-11-28 MED ORDER — LORAZEPAM 2 MG/ML IJ SOLN
1.0000 mg | Freq: Once | INTRAMUSCULAR | Status: AC
  Administered 2019-11-28: 1 mg via INTRAVENOUS

## 2019-11-28 MED ORDER — LACTATED RINGERS IV BOLUS
1000.0000 mL | Freq: Once | INTRAVENOUS | Status: AC
  Administered 2019-11-28: 1000 mL via INTRAVENOUS

## 2019-11-28 MED ORDER — DEXMEDETOMIDINE HCL IN NACL 400 MCG/100ML IV SOLN
0.4000 ug/kg/h | INTRAVENOUS | Status: DC
  Administered 2019-11-28: 1.2 ug/kg/h via INTRAVENOUS
  Administered 2019-11-28: 0.4 ug/kg/h via INTRAVENOUS
  Administered 2019-11-28 (×2): 1.2 ug/kg/h via INTRAVENOUS
  Administered 2019-11-29: 1.5 ug/kg/h via INTRAVENOUS
  Administered 2019-11-29: 1.2 ug/kg/h via INTRAVENOUS
  Filled 2019-11-28 (×5): qty 100

## 2019-11-28 MED ORDER — HEPARIN SODIUM (PORCINE) 5000 UNIT/ML IJ SOLN
5000.0000 [IU] | Freq: Three times a day (TID) | INTRAMUSCULAR | Status: DC
  Administered 2019-11-28 – 2019-11-30 (×6): 5000 [IU] via SUBCUTANEOUS
  Filled 2019-11-28 (×5): qty 1

## 2019-11-28 MED ORDER — INSULIN DETEMIR 100 UNIT/ML ~~LOC~~ SOLN
5.0000 [IU] | Freq: Two times a day (BID) | SUBCUTANEOUS | Status: DC
  Administered 2019-11-28: 5 [IU] via SUBCUTANEOUS
  Filled 2019-11-28 (×4): qty 0.05

## 2019-11-28 NOTE — Procedures (Signed)
Patient Name: Dalton Harrison  MRN: [id number]  Epilepsy Attending: Charlsie Quest  Referring Physician/Provider: Dr Cheri Fowler Date: 11/28/2019 Duration: 21.26 mins  Patient history: 24 y.o. M with PMH of poorly controlled Type 1 DM who presented with confusion and AMS.  EEG to evaluate for seizure  Level of alertness: Awake  AEDs during EEG study: None  Technical aspects: This EEG study was done with scalp electrodes positioned according to the 10-20 International system of electrode placement. Electrical activity was acquired at a sampling rate of 500Hz  and reviewed with a high frequency filter of 70Hz  and a low frequency filter of 1Hz . EEG data were recorded continuously and digitally stored.   Description: No posterior dominant rhythm was seen. EEG showed continuous generalized 2-3Hz  high amplitude delta slowing. Hyperventilation and photic stimulation were not performed.     ABNORMALITY -Continuous slow, generalized  IMPRESSION: This study is suggestive of moderate to severe diffuse encephalopathy, nonspecific etiology. No seizures or epileptiform discharges were seen throughout the recording.  [name] 

## 2019-11-28 NOTE — ED Notes (Signed)
RASS -1 

## 2019-11-28 NOTE — ED Notes (Signed)
APPEARS TO BE RESTING MORE QUIETLY AT THIS TIME, RASS -1 AT THIS TIME AS WELL, SAFETY MEASURES REMAIN IN PLACE, SR X 2 UP, BED IN LOWEST POSITION, RN CONTINUOUSLY AT BEDSIDE, SOFT WRIST DEVICES REMAIN IN PLACE, CMS OF RT HAND AND LT HAND AGAIN REASSESSED. NOTED TO WNL

## 2019-11-28 NOTE — ED Notes (Signed)
MOUTH CARE PROVIDED, CMS OF RT HAND AND LT HAND REASSESSED DUE TO SOFT WRIST SAFETY DEVICES IN PLACE, ALSO REASSESSED FOR NEED OF SOFT WRIST DEVICES INDICATES CONTINUATION DUE TO PT ATTEMPTING TO GET OUT OF BED, ATTEMPTING TO PULL OUT IV AND REMOVE URINARY CATHETER. ATTEMPTING TO REORIENT CLIENT AND EXPLAIN CARE BEING GIVEN. WILL HAVE INTERMITTENT PERIODS OF CALMNESS AND CLOSING EYES. SAFETY MEASURES REMAIN IN PLACE

## 2019-11-28 NOTE — Progress Notes (Signed)
eLink Physician-Brief Progress Note Patient Name: Dalton Harrison DOB: [birth year] MRN: [id number]   Date of Service  11/28/2019  HPI/Events of Note  Lactic Acid = 4.0 --> 2.2. Lactic Acid is clearing.   eICU Interventions  Continue to trend Lactic Acid level.      Intervention Category Major Interventions: Acid-Base disturbance - evaluation and management  Silvia Markuson Eugene 11/28/2019, 5:13 AM

## 2019-11-28 NOTE — Progress Notes (Signed)
EEG complete - results pending 

## 2019-11-28 NOTE — Progress Notes (Signed)
Pt unavailibe will try back when schedule permits

## 2019-11-28 NOTE — Progress Notes (Signed)
Inpatient Diabetes Program Recommendations  AACE/ADA: New Consensus Statement on Inpatient Glycemic Control (2015)  Target Ranges:  Prepandial:   less than 140 mg/dL      Peak postprandial:   less than 180 mg/dL (1-2 hours)      Critically ill patients:  140 - 180 mg/dL   Lab Results  Component Value Date   GLUCAP 408 (H) 11/28/2019    Review of Glycemic Control Results for KEYDEN, PAVLOV (MRN [id number]) as of 11/28/2019 10:00  Ref. Range 11/28/2019 08:46 11/28/2019 09:39 11/28/2019 10:17  Glucose-Capillary Latest Ref Range: 70 - 99 mg/dL 409 (H) 811 (H) 914 (H)   Diabetes history: Type 1 DM Outpatient Diabetes medications: Per outpatient endo in 2019 - Lantus 33 units QAM, Novolog 1:10 Current orders for Inpatient glycemic control: IV insulin  Inpatient Diabetes Program Recommendations:    Continue with IV insulin. Consider adding A1C to determine outpatient glycemic control.  Will follow.   Thanks, Lujean Rave, MSN, RNC-OB Diabetes Coordinator [phone] (8a-5p)

## 2019-11-28 NOTE — H&P (Signed)
NAME:  Dalton Harrison, MRN:  [id number], DOB:  1995/07/15, LOS: 0 ADMISSION DATE:  11/27/2019, CONSULTATION DATE:  11/28/19 REFERRING MD:  EDP, CHIEF COMPLAINT:  AMS   Brief History   24 y.o. M with PMH of Type 1 DM who presented to [place] ED with AMS, found to hyperglycemic in DKA with glucose 1700, pH 7.16.  Also covid positive without infiltrates on CXR.  PCCM consulted for transfer to [facility] for ICU admission  History of present illness    Patient is obtunded on arrival, he received Ativan in the ED, history obtained from chart review Dalton Harrison is a 24 y.o. M with PMH of poorly controlled Type 1 DM who presented to [facility] ED with confusion and AMS.  His father stated that he is usually compliant with insulin, but had been spending a lot of time with his friends who reported worsening confusion and lethargy, so father brought him to the ED.    His initial glucose was 1,743 with bicarb of 7, anion gap 38, lactic acid 4.0, pH 7.16.  WBC 30k without clear source of infection on CXR or UA.   UDS positive for cannabinoids. He received 2L Normal saline and 1L LR and maintenance fluids along with Vancomycin, Flagyl and Cefepime and was started on insulin gtt  Found to be Covid positive on routine testing, chest x-ray clear without infiltrates  Past Medical History   has a past medical history of Diabetes mellitus without complication (HCC).   Significant Hospital Events   9/19 [place] 9/20 Transfer to [facility] ICU  Consults:    Procedures:    Significant Diagnostic Tests:  9/19 CT head>> no acute findings 9/19 CXR>>no infiltrates  Micro Data:  9/19 Sars-cov-2>>positive 9/19 BCx2>>   Antimicrobials:  Vancomycin 9/19- Flagyl 9/19- Cefepime 9/19-  Interim history/subjective:  Pt transferred to [facility] in stable condition  Objective   Blood pressure 127/65, pulse 99, temperature (!) 94.3 F (34.6 C), temperature source Bladder, resp. rate (!) 24, SpO2 100  %.        Intake/Output Summary (Last 24 hours) at 11/28/2019 0026 Last data filed at 11/28/2019 0013 Gross per 24 hour  Intake 2000 ml  Output 200 ml  Net 1800 ml   There were no vitals filed for this visit.  General: Young man, unresponsive, in no distress HEENT: No JVD, no pallor or icterus, dry mucosa Neuro: Unresponsive, pupils 3 mm bilaterally equally reactive to light, no meningeal signs CV: s1s2 tacky, no murmur PULM: Clear to auscultation, no rhonchi GI: soft, bsx4 active  Extremities: warm/dry, no edema  Skin: no rashes or lesions   Chest x-ray independently reviewed which did not show any infiltrates or effusions  Resolved Hospital Problem list     Assessment & Plan:   Diabetic Ketoacidosis Follows with [facility] and has a history of poor compliance and gastroparesis.  Initially hypotensive, though improved with IVF. -Continue insulin gtt per endotool -Lactated ringers x 3rd L, transition to D5 LR when glucose <250 , follow beta hydroxybutyrate -monitor electrolytes  -q4hr BMP   Acute encephalopathy Likely secondary to DKA, CT head negative -Currently unresponsive due to Ativan given in ED -For agitation use low-dose Precedex, goal RASS 0 -Restraints as needed -If persistently altered, may have to consider imaging for cerebral venous thrombosis    Covid-19 positive Unclear vaccination status, no reported symptoms, likely incidental finding with clear CXR.  Do not suspect active infection precipitating his DKA -  We will treat with IV remdesivir   AKI /hyperkalemia -Likely prerenal related to DKA, expect to improve with hydration. Total body potassium stores likely low, continue to monitor and replete as the drop. Hypocalcemia will be repleted  SIRS  WBC 30K with lactic acid of 4.0 -No clear source of infection, received broad spectrum abx     Best practice:  Diet: NPO Pain/Anxiety/Delirium protocol (if indicated): Precedex VAP  protocol (if indicated): N/A DVT prophylaxis: Subcu heparin GI prophylaxis: N/A Glucose control: IV insulin Mobility: Bedrest Code Status: Full Family Communication: Pending Disposition: ICU  Labs   CBC: Recent Labs  Lab 11/27/19 2141 11/27/19 2150  WBC 30.9*  --   NEUTROABS 26.9*  --   HGB 14.1 15.6  HCT 50.2 46.0  MCV 107.7*  --   PLT 574*  --     Basic Metabolic Panel: Recent Labs  Lab 11/27/19 2141 11/27/19 2150  NA 125* 123*  K 6.0* 5.7*  CL 80*  --   CO2 7*  --   GLUCOSE 1,743*  --   BUN 82*  --   CREATININE 4.41*  --   CALCIUM 7.4*  --    GFR: CrCl cannot be calculated (Unknown ideal weight.). Recent Labs  Lab 11/27/19 2141  WBC 30.9*  LATICACIDVEN 4.0*    Liver Function Tests: Recent Labs  Lab 11/27/19 2141  AST 39  ALT 34  ALKPHOS 134*  BILITOT 1.7*  PROT 6.3*  ALBUMIN 4.1   Recent Labs  Lab 11/27/19 2141  LIPASE 20   No results for input(s): AMMONIA in the last 168 hours.  ABG    Component Value Date/Time   HCO3 6.6 (L) 11/27/2019 2150   TCO2 7 (L) 11/27/2019 2150   ACIDBASEDEF 21.0 (H) 11/27/2019 2150   O2SAT 94.0 11/27/2019 2150     Coagulation Profile: No results for input(s): INR, PROTIME in the last 168 hours.  Cardiac Enzymes: No results for input(s): CKTOTAL, CKMB, CKMBINDEX, TROPONINI in the last 168 hours.  HbA1C: No results found for: HGBA1C  CBG: Recent Labs  Lab 11/27/19 2137 11/27/19 2331  GLUCAP >600* >600*    Review of Systems:   Unable to obtain since he is obtunded  Past Medical History  He,  has a past medical history of Diabetes mellitus without complication (HCC).   Surgical History   History reviewed. No pertinent surgical history.   Social History   reports that he has quit smoking. He has quit using smokeless tobacco. He reports previous alcohol use. He reports previous drug use. Drug: Marijuana.   Family History   His family history is not on file.   Allergies No Known Allergies    Home Medications  Prior to Admission medications   Medication Sig Start Date End Date Taking? Authorizing Provider  ondansetron (ZOFRAN ODT) 4 MG disintegrating tablet 4mg  ODT q4 hours prn nausea/vomit 04/15/19   06/13/19, DO  promethazine (PHENERGAN) 25 MG suppository Place 1 suppository (25 mg total) rectally every 6 (six) hours as needed for nausea or vomiting. 04/15/19   06/13/19, DO     Critical care time: 79m     48m MD. [facility] See Amion for pager  If no response to pager , please call [phone]  After 7:00 pm call Elink  [phone]   11/28/2019

## 2019-11-28 NOTE — ED Notes (Signed)
RASS -2

## 2019-11-28 NOTE — ED Notes (Signed)
[facility] TRANSPORT TEAM AT BEDSIDE, BEDSIDE HANDOFF REPORT PROVIDED TO TEAM

## 2019-11-28 NOTE — ED Notes (Signed)
IV INSULIN INFUSING INTO RT AC AT 5.5 UNITS/HR, W/ NS MAINTENANCE FLUID AT INTO LEFT AC 20G IV CATHETER // LR AT INFUSING INTO LEFT AC 20G IV CATHETER

## 2019-11-29 LAB — BASIC METABOLIC PANEL
Anion gap: 9 (ref 5–15)
BUN: 44 mg/dL — ABNORMAL HIGH (ref 6–20)
CO2: 32 mmol/L (ref 22–32)
Calcium: 8.5 mg/dL — ABNORMAL LOW (ref 8.9–10.3)
Chloride: 117 mmol/L — ABNORMAL HIGH (ref 98–111)
Creatinine, Ser: 1.86 mg/dL — ABNORMAL HIGH (ref 0.61–1.24)
GFR calc Af Amer: 58 mL/min — ABNORMAL LOW (ref >60)
GFR calc non Af Amer: 50 mL/min — ABNORMAL LOW (ref >60)
Glucose, Bld: 68 mg/dL — ABNORMAL LOW (ref 70–99)
Potassium: 3.6 mmol/L (ref 3.5–5.1)
Sodium: 158 mmol/L — ABNORMAL HIGH (ref 135–145)

## 2019-11-29 LAB — GLUCOSE, CAPILLARY
Glucose-Capillary: 104 mg/dL — ABNORMAL HIGH (ref 70–99)
Glucose-Capillary: 125 mg/dL — ABNORMAL HIGH (ref 70–99)
Glucose-Capillary: 127 mg/dL — ABNORMAL HIGH (ref 70–99)
Glucose-Capillary: 161 mg/dL — ABNORMAL HIGH (ref 70–99)
Glucose-Capillary: 169 mg/dL — ABNORMAL HIGH (ref 70–99)
Glucose-Capillary: 198 mg/dL — ABNORMAL HIGH (ref 70–99)
Glucose-Capillary: 20 mg/dL — CL (ref 70–99)
Glucose-Capillary: 237 mg/dL — ABNORMAL HIGH (ref 70–99)
Glucose-Capillary: 270 mg/dL — ABNORMAL HIGH (ref 70–99)
Glucose-Capillary: 476 mg/dL — ABNORMAL HIGH (ref 70–99)
Glucose-Capillary: 52 mg/dL — ABNORMAL LOW (ref 70–99)
Glucose-Capillary: 74 mg/dL (ref 70–99)
Glucose-Capillary: 77 mg/dL (ref 70–99)
Glucose-Capillary: 78 mg/dL (ref 70–99)

## 2019-11-29 LAB — BLOOD GAS, VENOUS
Acid-Base Excess: 5.7 mmol/L — ABNORMAL HIGH (ref 0.0–2.0)
Bicarbonate: 30.8 mmol/L — ABNORMAL HIGH (ref 20.0–28.0)
FIO2: 21
O2 Saturation: 93.4 %
Patient temperature: 36.1
pCO2, Ven: 51.9 mmHg (ref 44.0–60.0)
pH, Ven: 7.385 (ref 7.250–7.430)
pO2, Ven: 63 mmHg — ABNORMAL HIGH (ref 32.0–45.0)

## 2019-11-29 MED ORDER — POTASSIUM CHLORIDE 10 MEQ/100ML IV SOLN
10.0000 meq | INTRAVENOUS | Status: DC
  Administered 2019-11-29: 10 meq via INTRAVENOUS
  Filled 2019-11-29 (×3): qty 100

## 2019-11-29 MED ORDER — DEXTROSE 10 % IV SOLN: INTRAVENOUS | Status: DC

## 2019-11-29 MED ORDER — INSULIN GLARGINE 100 UNIT/ML ~~LOC~~ SOLN
8.0000 [IU] | Freq: Two times a day (BID) | SUBCUTANEOUS | Status: DC
  Administered 2019-11-29 (×2): 8 [IU] via SUBCUTANEOUS
  Filled 2019-11-29 (×5): qty 0.08

## 2019-11-29 MED ORDER — HALOPERIDOL LACTATE 5 MG/ML IJ SOLN
INTRAMUSCULAR | Status: AC
  Administered 2019-11-29: 2 mg
  Filled 2019-11-29: qty 1

## 2019-11-29 MED ORDER — KCL IN DEXTROSE-NACL 20-5-0.45 MEQ/L-%-% IV SOLN
INTRAVENOUS | Status: DC
  Filled 2019-11-29 (×2): qty 1000

## 2019-11-29 MED ORDER — INSULIN ASPART 100 UNIT/ML ~~LOC~~ SOLN
0.0000 [IU] | Freq: Three times a day (TID) | SUBCUTANEOUS | Status: DC
  Administered 2019-11-29: 9 [IU] via SUBCUTANEOUS
  Administered 2019-11-29 – 2019-11-30 (×2): 5 [IU] via SUBCUTANEOUS

## 2019-11-29 MED ORDER — CLONIDINE HCL 0.1 MG PO TABS
0.1000 mg | ORAL_TABLET | Freq: Two times a day (BID) | ORAL | Status: DC
  Administered 2019-11-29 – 2019-11-30 (×3): 0.1 mg via ORAL
  Filled 2019-11-29 (×3): qty 1

## 2019-11-29 MED ORDER — POTASSIUM CHLORIDE 20 MEQ PO PACK
40.0000 meq | PACK | Freq: Once | ORAL | Status: AC
  Administered 2019-11-29: 40 meq via ORAL
  Filled 2019-11-29: qty 2

## 2019-11-29 MED ORDER — HALOPERIDOL LACTATE 5 MG/ML IJ SOLN
2.0000 mg | Freq: Once | INTRAMUSCULAR | Status: AC
  Administered 2019-11-29: 2 mg via INTRAVENOUS
  Filled 2019-11-29: qty 1

## 2019-11-29 MED ORDER — DEXTROSE 50 % IV SOLN
INTRAVENOUS | Status: AC
  Filled 2019-11-29: qty 50

## 2019-11-29 MED ORDER — HALOPERIDOL LACTATE 5 MG/ML IJ SOLN: 2.0000 mg | Freq: Once | INTRAMUSCULAR | Status: AC

## 2019-11-29 NOTE — Progress Notes (Signed)
Pt dislodged foley catheter, frank blood observed in the foley catheter tubing. Dr. Merrily Pew notified immediately. VO given to remove foley catheter. Foley catheter was removed, tip of the catheter observed to be intact. Frank blood erupted from pt's penis. Pt states that "it doesn't hurt anymore, I just wanted it out." Pt instructed on how to use a urinal but it is uncertain if pt is oriented enough to understand the given direction. Will continue to monitor and assess for frank bleeding, will attempt to re-educate on urinal use.

## 2019-11-29 NOTE — Progress Notes (Signed)
NAME:  Dalton Harrison, MRN:  [id number], DOB:  [birth year], LOS: 1 ADMISSION DATE:  11/27/2019, CONSULTATION DATE:  11/29/19 REFERRING MD:  EDP, CHIEF COMPLAINT:  AMS   Brief History   24 y.o. M with PMH of Type 1 DM who presented to [place] ED with AMS, found to hyperglycemic in DKA with glucose 1700, pH 7.16.  Also covid positive without infiltrates on CXR.  PCCM consulted for transfer to [facility] for ICU admission  History of present illness    Patient is obtunded on arrival, he received Ativan in the ED, history obtained from chart review Dalton Harrison is a 24 y.o. M with PMH of poorly controlled Type 1 DM who presented to [facility] ED with confusion and AMS.  His father stated that he is usually compliant with insulin, but had been spending a lot of time with his friends who reported worsening confusion and lethargy, so father brought him to the ED.    His initial glucose was 1,743 with bicarb of 7, anion gap 38, lactic acid 4.0, pH 7.16.  WBC 30k without clear source of infection on CXR or UA.   UDS positive for cannabinoids. He received 2L Normal saline and 1L LR and maintenance fluids along with Vancomycin, Flagyl and Cefepime and was started on insulin gtt  Found to be Covid positive on routine testing, chest x-ray clear without infiltrates  Past Medical History   has a past medical history of Diabetes mellitus without complication (HCC).   Significant Hospital Events   9/19 [place] 9[place]20 Transfer to [facility] ICU  Consults:    Procedures:    Significant Diagnostic Tests:  9/19 CT head>> no acute findings 9/19 CXR>>no infiltrates  Micro Data:  9/19 Sars-cov-2>>positive 9/19 BCx2>> 9/20 MRSA PCR negative   Antimicrobials:  Vancomycin 9/19 Flagyl 9/19 Cefepime 9/19  Interim history/subjective:  Patient's anion gap got closed, he was given long-acting insulin Overnight patient had a few episodes of hypoglycemia, long-acting insulin was stopped    Hypoglycemia improved after D50, he is on D5 infusion at this time  Objective   Blood pressure 126/69, pulse 70, temperature 98.2 F (36.8 C), temperature source Bladder, resp. rate 17, height 5\' 11"  (1.803 m), weight 63.5 kg, SpO2 95 %.        Intake/Output Summary (Last 24 hours) at 11/29/2019 1045 Last data filed at 11/29/2019 0554 Gross per 24 hour  Intake 1905.19 ml  Output 3000 ml  Net -1094.81 ml   Filed Weights   11/28/19 0210 11/28/19 0400  Weight: 59 kg 63.5 kg    General: Young man, lying in the bed, restless HEENT: No JVD, no pallor or icterus, severely dry mucosa Neuro: Awake, restless oriented to self only.  Following simple commands intermittently.  Moving all 4 extremities spontaneously CV: s1s2 tacky, no murmur PULM: Clear to auscultation, no rhonchi GI: soft, bsx4 active  Extremities: warm/dry, no edema  Skin: no rashes or lesions  Resolved Hospital Problem list   Diabetic ketoacidosis Hyperkalemia  Assessment & Plan:   Poorly controlled diabetes presented with diabetic ketoacidosis Multiple episodes of hypoglycemia overnight  Hemoglobin A1c is pending Anion gap got closed Patient was given long-acting insulin but then he had multiple episodes of hypoglycemia Discontinue long-acting insulin for now Continue sliding scale insulin Continue D5 half NS   Acute toxic/metabolic encephalopathy Likely secondary to DKA and possible drug-induced Patient's urine culture came back positive for THC CT head negative Now he is much more  alert and awake but agitated and restless We will give 2 mg of IV Haldol now  Acute kidney injury Due to severe dehydration leading to prerenal AKI 5 L IV fluid, continue D5 half NS Monitor serum creatinine  Covid-19 positive Unclear vaccination status, no reported symptoms, likely incidental finding with clear CXR.  Do not suspect active infection precipitating his DKA We will treat with IV  remdesivir  Hypernatremia Likely due to severe dehydration Continue IV fluid Monitor serum chemistry  hyperkalemia Resolved  SIRS  WBC 30K with lactic acid of 4.0 No clear source of infection, received broad spectrum abx Now off antibiotics   Best practice:  Diet: Diabetic diet Pain/Anxiety/Delirium protocol (if indicated): As needed Haldol VAP protocol (if indicated): N/A DVT prophylaxis: Subcu heparin GI prophylaxis: N/A Glucose control: IV insulin Mobility: Bedrest Code Status: Full Family Communication: Patient's mother was updated Disposition: Transfer to MedSurg floor  Labs   CBC: Recent Labs  Lab 11/27/19 2141 11/27/19 2150  WBC 30.9*  --   NEUTROABS 26.9*  --   HGB 14.1 15.6  HCT 50.2 46.0  MCV 107.7*  --   PLT 574*  --     Basic Metabolic Panel: Recent Labs  Lab 11/27/19 2141 11/27/19 2150 11/28/19 0635 11/28/19 1235 11/29/19 0549  NA 125* 123* 135 151* 158*  K 6.0* 5.7* 3.9 4.0 3.6  CL 80*  --  97* 110 117*  CO2 7*  --  15* 28 32  GLUCOSE 1,743*  --  1,144* 201* 68*  BUN 82*  --  79* 64* 44*  CREATININE 4.41*  --  4.79* 3.31* 1.86*  CALCIUM 7.4*  --  7.0* 7.4* 8.5*   GFR: Estimated Creatinine Clearance: 56 mL/min (A) (by C-G formula based on SCr of 1.86 mg/dL (H)). Recent Labs  Lab 11/27/19 2141 11/28/19 0423  WBC 30.9*  --   LATICACIDVEN 4.0* 2.2*    Liver Function Tests: Recent Labs  Lab 11/27/19 2141  AST 39  ALT 34  ALKPHOS 134*  BILITOT 1.7*  PROT 6.3*  ALBUMIN 4.1   Recent Labs  Lab 11/27/19 2141  LIPASE 20   No results for input(s): AMMONIA in the last 168 hours.  ABG    Component Value Date/Time   HCO3 30.8 (H) 11/29/2019 0231   TCO2 7 (L) 11/27/2019 2150   ACIDBASEDEF 21.0 (H) 11/27/2019 2150   O2SAT 93.4 11/29/2019 0231     Coagulation Profile: No results for input(s): INR, PROTIME in the last 168 hours.  Cardiac Enzymes: No results for input(s): CKTOTAL, CKMB, CKMBINDEX, TROPONINI in the last 168  hours.  HbA1C: No results found for: HGBA1C  CBG: Recent Labs  Lab 11/29/19 0508 11/29/19 0549 11/29/19 0737 11/29/19 0758 11/29/19 0926  GLUCAP 78 74 52* 237* 198*    Review of Systems:   Unable to obtain since he is obtunded  Past Medical History  He,  has a past medical history of Diabetes mellitus without complication (HCC).   Surgical History   History reviewed. No pertinent surgical history.   Social History   reports that he has quit smoking. He has quit using smokeless tobacco. He reports previous alcohol use. He reports previous drug use. Drug: Marijuana.   Family History   His family history is not on file.   Allergies No Known Allergies   Home Medications  Prior to Admission medications   Medication Sig Start Date End Date Taking? Authorizing Provider  ondansetron (ZOFRAN ODT) 4 MG disintegrating tablet 4mg   ODT q4 hours prn nausea/vomit 04/15/19   Melene Plan, DO  promethazine (PHENERGAN) 25 MG suppository Place 1 suppository (25 mg total) rectally every 6 (six) hours as needed for nausea or vomiting. 04/15/19   Melene Plan, DO     Cheri Fowler MD Critical care physician [facility] [place] [facility]  Pager: [phone] Mobile: [phone]

## 2019-11-29 NOTE — Progress Notes (Signed)
Attempts to obtain PIV access unsuccesful . Patient remains very restless while on bil WR. Attempting to sit up, pulling arms away. Primary nurse is present during attempts for IV access.

## 2019-11-29 NOTE — Progress Notes (Addendum)
Inpatient Diabetes Program Recommendations  AACE/ADA: New Consensus Statement on Inpatient Glycemic Control (2015)  Target Ranges:  Prepandial:   less than 140 mg/dL      Peak postprandial:   less than 180 mg/dL (1-2 hours)      Critically ill patients:  140 - 180 mg/dL   Lab Results  Component Value Date   GLUCAP 198 (H) 11/29/2019    Review of Glycemic Control Results for Dalton Harrison, Dalton Harrison (MRN [id number]) as of 11/29/2019 10:55  Ref. Range 11/29/2019 02:54 11/29/2019 03:18 11/29/2019 03:39 11/29/2019 05:08  Glucose-Capillary Latest Ref Range: 70 - 99 mg/dL 20 (LL) 071 (H) 219 (H) 78   Diabetes history: Type 1 DM Outpatient Diabetes medications: Per outpatient endo in 2019 - Lantus 33 units QAM, Novolog 1:10 Current orders for Inpatient glycemic control: Novolog 0-9 units TID D5% 162ml/hr  Inpatient Diabetes Program Recommendations:    Noted multiple episodes of hypoglycemia and subsequent interventions. Unclear explanation for lows. Agreement with D5%.   Since patient is type 1 DM & NPO, would recommend Novolog 0-6 units Q4H. Secure chat sent to MD. No orders received. Plan to continue with D5% and 0-9 TID.  Addendum @1341 : Lunch time CBG >400 mg/dL. Would now consider adding Levemir 5 units QD (to start now) and Novolog 3 units TID (assuming patient is consuming >50% of meal).  Thanks, , MSN, RNC-OB Diabetes Coordinator [phone] (8a-5p)

## 2019-11-29 NOTE — Progress Notes (Signed)
eLink Physician-Brief Progress Note Patient Name: Dalton Harrison DOB: September 08, 1995 MRN: [id number]   Date of Service  11/29/2019  HPI/Events of Note  Hypoglycemia - Blood glucose = 42.   eICU Interventions  Plan: 1. D10W IV infusion to run at 30 mL/hour.      Intervention Category Major Interventions: Other:  Lenell Antu 11/29/2019, 12:05 AM

## 2019-11-29 NOTE — Progress Notes (Signed)
eLink Physician-Brief Progress Note Patient Name: Dalton Harrison DOB: 20-Aug-1995 MRN: [id number]   Date of Service  11/29/2019  HPI/Events of Note  Agitation   eICU Interventions  Plan: 1. Increase Precedex IV infusion ceiling to 1.5 mcg/kg/hour. Titrate to RASS  = 0.     Intervention Category Major Interventions: Delirium, psychosis, severe agitation - evaluation and management  Dalton Harrison 11/29/2019, 3:26 AM

## 2019-11-29 NOTE — Progress Notes (Signed)
eLink Physician-Brief Progress Note Patient Name: Dalton Harrison DOB: 03/30/1995 MRN: [id number]   Date of Service  11/29/2019  HPI/Events of Note  Agitation - Lost IV access for Precedex. Qtc interval = 0.4 seconds.   eICU Interventions  Plan: 1. Haldol 2 mg IV now.      Intervention Category Major Interventions: Delirium, psychosis, severe agitation - evaluation and management  Hewitt Garner Eugene 11/29/2019, 3:54 AM

## 2019-11-30 DIAGNOSIS — E1011 Type 1 diabetes mellitus with ketoacidosis with coma: Secondary | ICD-10-CM | POA: Diagnosis not present

## 2019-11-30 DIAGNOSIS — U071 COVID-19: Secondary | ICD-10-CM | POA: Diagnosis not present

## 2019-11-30 LAB — CBC WITH DIFFERENTIAL/PLATELET
Abs Immature Granulocytes: 0.07 10*3/uL (ref 0.00–0.07)
Basophils Absolute: 0 10*3/uL (ref 0.0–0.1)
Basophils Relative: 0 %
Eosinophils Absolute: 0.4 10*3/uL (ref 0.0–0.5)
Eosinophils Relative: 4 %
HCT: 41.1 % (ref 39.0–52.0)
Hemoglobin: 14 g/dL (ref 13.0–17.0)
Immature Granulocytes: 1 %
Lymphocytes Relative: 17 %
Lymphs Abs: 1.8 10*3/uL (ref 0.7–4.0)
MCH: 30.7 pg (ref 26.0–34.0)
MCHC: 34.1 g/dL (ref 30.0–36.0)
MCV: 90.1 fL (ref 80.0–100.0)
Monocytes Absolute: 0.7 10*3/uL (ref 0.1–1.0)
Monocytes Relative: 6 %
Neutro Abs: 7.7 10*3/uL (ref 1.7–7.7)
Neutrophils Relative %: 72 %
Platelets: 176 10*3/uL (ref 150–400)
RBC: 4.56 MIL/uL (ref 4.22–5.81)
RDW: 12 % (ref 11.5–15.5)
WBC: 10.8 10*3/uL — ABNORMAL HIGH (ref 4.0–10.5)
nRBC: 0 % (ref 0.0–0.2)

## 2019-11-30 LAB — COMPREHENSIVE METABOLIC PANEL
ALT: 68 U/L — ABNORMAL HIGH (ref 0–44)
AST: 83 U/L — ABNORMAL HIGH (ref 15–41)
Albumin: 3.3 g/dL — ABNORMAL LOW (ref 3.5–5.0)
Alkaline Phosphatase: 95 U/L (ref 38–126)
Anion gap: 11 (ref 5–15)
BUN: 12 mg/dL (ref 6–20)
CO2: 33 mmol/L — ABNORMAL HIGH (ref 22–32)
Calcium: 8.8 mg/dL — ABNORMAL LOW (ref 8.9–10.3)
Chloride: 99 mmol/L (ref 98–111)
Creatinine, Ser: 1.17 mg/dL (ref 0.61–1.24)
GFR calc Af Amer: >60 mL/min (ref >60)
GFR calc non Af Amer: >60 mL/min (ref >60)
Glucose, Bld: 294 mg/dL — ABNORMAL HIGH (ref 70–99)
Potassium: 4 mmol/L (ref 3.5–5.1)
Sodium: 143 mmol/L (ref 135–145)
Total Bilirubin: 0.9 mg/dL (ref 0.3–1.2)
Total Protein: 5.9 g/dL — ABNORMAL LOW (ref 6.5–8.1)

## 2019-11-30 LAB — BASIC METABOLIC PANEL
Anion gap: 10 (ref 5–15)
BUN: 14 mg/dL (ref 6–20)
CO2: 31 mmol/L (ref 22–32)
Calcium: 8.5 mg/dL — ABNORMAL LOW (ref 8.9–10.3)
Chloride: 101 mmol/L (ref 98–111)
Creatinine, Ser: 0.98 mg/dL (ref 0.61–1.24)
GFR calc Af Amer: >60 mL/min (ref >60)
GFR calc non Af Amer: >60 mL/min (ref >60)
Glucose, Bld: 329 mg/dL — ABNORMAL HIGH (ref 70–99)
Potassium: 3.4 mmol/L — ABNORMAL LOW (ref 3.5–5.1)
Sodium: 142 mmol/L (ref 135–145)

## 2019-11-30 LAB — GLUCOSE, CAPILLARY
Glucose-Capillary: 289 mg/dL — ABNORMAL HIGH (ref 70–99)
Glucose-Capillary: 283 mg/dL — ABNORMAL HIGH (ref 70–99)
Glucose-Capillary: 291 mg/dL — ABNORMAL HIGH (ref 70–99)

## 2019-11-30 LAB — PHOSPHORUS: Phosphorus: 1.4 mg/dL — ABNORMAL LOW (ref 2.5–4.6)

## 2019-11-30 LAB — MAGNESIUM
Magnesium: 1.9 mg/dL (ref 1.7–2.4)
Magnesium: 2.3 mg/dL (ref 1.7–2.4)

## 2019-11-30 LAB — HEMOGLOBIN A1C
Hgb A1c MFr Bld: 10 % — ABNORMAL HIGH (ref 4.8–5.6)
Mean Plasma Glucose: 240.3 mg/dL

## 2019-11-30 MED ORDER — INSULIN ASPART 100 UNIT/ML ~~LOC~~ SOLN
0.0000 [IU] | Freq: Three times a day (TID) | SUBCUTANEOUS | Status: DC
  Administered 2019-11-30: 8 [IU] via SUBCUTANEOUS

## 2019-11-30 MED ORDER — INSULIN ASPART 100 UNIT/ML ~~LOC~~ SOLN: 0.0000 [IU] | Freq: Every day | SUBCUTANEOUS | Status: DC

## 2019-11-30 MED ORDER — BLOOD GLUCOSE MONITOR KIT: PACK | 0 refills | Status: AC

## 2019-11-30 MED ORDER — LACTATED RINGERS IV BOLUS
1000.0000 mL | Freq: Once | INTRAVENOUS | Status: AC
  Administered 2019-11-30: 1000 mL via INTRAVENOUS

## 2019-11-30 MED ORDER — INSULIN GLARGINE 100 UNIT/ML ~~LOC~~ SOLN
12.0000 [IU] | Freq: Two times a day (BID) | SUBCUTANEOUS | Status: DC
  Administered 2019-11-30: 12 [IU] via SUBCUTANEOUS
  Filled 2019-11-30 (×2): qty 0.12

## 2019-11-30 MED ORDER — K PHOS MONO-SOD PHOS DI & MONO 155-852-130 MG PO TABS
500.0000 mg | ORAL_TABLET | Freq: Four times a day (QID) | ORAL | Status: DC
  Administered 2019-11-30: 500 mg via ORAL
  Filled 2019-11-30: qty 2

## 2019-11-30 MED ORDER — HALOPERIDOL LACTATE 5 MG/ML IJ SOLN
2.0000 mg | Freq: Once | INTRAMUSCULAR | Status: AC
  Administered 2019-11-30: 2 mg via INTRAVENOUS
  Filled 2019-11-30: qty 1

## 2019-11-30 MED ORDER — ALPRAZOLAM 0.5 MG PO TABS
0.5000 mg | ORAL_TABLET | Freq: Once | ORAL | Status: AC | PRN
  Administered 2019-11-30: 0.5 mg via ORAL
  Filled 2019-11-30: qty 1

## 2019-11-30 MED ORDER — INSULIN GLARGINE 100 UNIT/ML ~~LOC~~ SOLN: 12.0000 [IU] | Freq: Two times a day (BID) | SUBCUTANEOUS | 11 refills | Status: AC

## 2019-11-30 MED FILL — TRUE METRIX GLUCOSE TEST ST: 25 days supply | Qty: 100 | Fill #0

## 2019-11-30 MED FILL — TRUEplus LANCETS 28G MISC: 25 days supply | Qty: 100 | Fill #0

## 2019-11-30 MED FILL — TRUE METRIX BLOOD GLUCOSE M: W/DEVICE | 1 days supply | Qty: 1 | Fill #0

## 2019-11-30 NOTE — Progress Notes (Signed)
Patient scheduled for outpatient Remdesivir infusions at 12, noon on Thursday 9/23 and 9/24 at [facility]. Please inform the patient to park at [address] [place], [place], as staff will be escorting the patient through the east entrance of the hospital. Appointments take approximately 45 minutes.    There is a wave flag banner located near the entrance on [address]. Turn into this entrance and immediately turn left and park in 1 of the 5 designated Covid Infusion Parking spots. There is a phone number on the sign, please call and let the staff know what spot you are in and we will come out and get you. For questions call [phone].  Thanks.

## 2019-11-30 NOTE — Progress Notes (Signed)
eLink Physician-Brief Progress Note Patient Name: Dalton Harrison DOB: [birth year] MRN: [id number]   Date of Service  11/30/2019  HPI/Events of Note  Notified of anxiety and unable to sleep Im unable to camera in this room but he is reportedly trying to get out of bed and asking for meds to help him sleep He was given haldol during th day  eICU Interventions  Will give a dose of xanax tonight If still agitated haldol reordered     Intervention Category Minor Interventions: Agitation / anxiety - evaluation and management  Darl Pikes 11/30/2019, 12:10 AM

## 2019-11-30 NOTE — Discharge Instructions (Signed)
Patient scheduled for outpatient Remdesivir infusions at 12, noon on Thursday 9/23 and 9/24 at [facility]. Please inform the patient to park at [address], [place], as staff will be escorting the patient through the east entrance of the hospital. Appointments take approximately 45 minutes.    There is a wave flag banner located near the entrance on [address] Turn into this entrance and immediately turn left and park in 1 of the 5 designated Covid Infusion Parking spots. There is a phone number on the sign, please call and let the staff know what spot you are in and we will come out and get you. For questions call [phone].  Thanks.    

## 2019-11-30 NOTE — TOC Transition Note (Signed)
Transition of Care [facility]) - CM/SW Discharge Note   Patient Details  Name: Dalton Harrison MRN: [id number] Date of Birth: 08/16/1995  Transition of [facility]) CM/SW Contact:  Beckie Busing, RN Phone Number: [phone]  11/30/2019, 2:54 PM   Clinical Narrative:    Glucometer, lancets and test strips delivered to patient from [facility] pharmacy. Patient to be discharged home with mother. No further needs noted at this time. TOC will sign off.    Final next level of care: Home/Self Care Barriers to Discharge: No Barriers Identified   Patient Goals and CMS Choice Patient states their goals for this hospitalization and ongoing recovery are:: Ready to go home   Choice offered to / list presented to : NA  Discharge Placement                       Discharge Plan and Services In-house Referral: NA Discharge Planning Services: CM Consult Post Acute Care Choice: NA          DME Arranged: N/A DME Agency: NA       HH Arranged: NA HH Agency: NA        Social Determinants of Health (SDOH) Interventions     Readmission Risk Interventions No flowsheet data found.

## 2019-11-30 NOTE — Discharge Summary (Signed)
Physician Discharge Summary  [name] RKY:[id number] DOB: [birth year] DOA: 11/27/2019  PCP: Gwynneth Macleod, MD  Admit date: 11/27/2019 Discharge date: 11/30/2019  Time spent: 40 minutes  Recommendations for Outpatient Follow-up:  1. Follow outpatient CBC/CMP 2. Follow blood sugars outpatient - adjust regimen as needed with PCP 3. Follow gastroparesis outpatient 4. Encourage MJ cessation 5. Follow hematuria outpatient - follow UA outpatient to ensure resolution  6. Complete course of remdesivir outpatient 7. Quarantine per [employer] protocol  8. Follow confusion outpatient, if persistence, consider additional imaging  Discharge Diagnoses:  Active Problems:   DKA (diabetic ketoacidoses) [facility])  Discharge Condition: stable  Diet recommendation: diabetic  Filed Weights   11/28/19 0210 11/28/19 0400 11/29/19 2009  Weight: 59 kg 63.5 kg 60.2 kg    History of present illness:  24 yo M with type 1 DM who presented to [place] ED with AMS and was found to be hyperglycemic with DKA and BG of 1700 and pH of 7.16.  He was also incidentally noted to be covid positive.  He was admitted to the ICU.  He's gradually improved with DKA treatment, but has had persistent encephalopathy requiring treatment with haldol/benzos.  He had EEG which was notable for severe diffuse encephalopathy.  He was improved on 9/22 the day of discharge.  Hospital Course:  T1DM  DKA  Hypoglycemia  Diabetic Gastroparesis Hyperglycemia with BG of 1700 and pH of 7.16 on presentation DKA resolved Takes sliding scale insulin at home in addition to lantus (he notes ~29 units of lantus Dalin Caldera day) Had hypoglycemia 9/20-21.  Lantus increased to 12 units BID today in addition to SSI.  Will discharge with BID lantus in addition to SSI.  He'll need to follow outpatient with his PCP for additional recs.  Discussed with pt and mother.    Acute Toxic Metabolic Encephalopathy Likely 2/2 DKA.  Also with urine tox positive for  THC. Negative head CT on admission for slurred speech EEG showed moderate to severe diffuse encephalopathy, nonspecific Neuro exam without focal deficits Agitation this AM, requiring xanax/haldol, but Jeilani Grupe&Ox4 this am when I spoke to him - he was insistent on d/c this am.  Speech this morning slurred, but improved on my second evaluation of him - he received xanax and haldol last night, suspect this maybe related to this.  Discussed with pt/mother/father, I think additional imaging at this point can be deferred as long as he continues to improve as most likely explanation is meds/delirium.  If persistent sx, would need further workup/imaging.  COVID 19 Positive Afebrile, normal CXR - appears incidental, though question if this may have been precipitant for DKA above. Started on remdesivir.  Will complete outpatient. Quarantine for 10 days  SIRS  Leukocytosis Afebrile, blood cx NGTD - improved from admission Lactic elevated on presentation, but improved No clear source of infection other than covid for which he appears to be asx Continue to monitor  Elevated LFT's Mild, possibly related to covid Follow outpatient  Marijuana Use Encourage cessation, especially in setting of gastroparesis  Acute Kidney Injury: resolved, continue to monitor outpatient  Hematuria: 2/2 foley trauma, gross hematuria in urinal.  Needs follow up outpatient.  Repeat UA outpatient.    Procedures: EEG IMPRESSION: This study is suggestive of moderate to severe diffuse encephalopathy, nonspecific etiology. No seizures or epileptiform discharges were seen throughout the recording.  Consultations:  PCCM  Discharge Exam: Vitals:   11/30/19 0400 11/30/19 0753  BP:  (!) 134/102  Pulse:  92  Resp:    Temp:  98.3 F (36.8 C)  SpO2: 99% 100%   Initially insistent on discharge.  Noted he was planning on discharge and that he knew his rights and we couldn't stop him.  Dalton Harrison&Ox4, he did have capacity to leave.  He  was more cooperative on my second evaluation and after speaking to his father.  Discussed case with father and mother, he'll d/c home with mom.  General: No acute distress. Cardiovascular: Heart sounds show Lakeysha Slutsky regular rate, and rhythm.  Lungs: Clear to auscultation bilaterally  Abdomen: Soft, nontender, nondistended Neurological: Alert and oriented 3. Moves all extremities 4 with equal strength. Cranial nerves II through XII intact.  Sensation intact to light touch.  Slurred speech more notable on my first evaluation of him, seemed improved when I saw him later in the AM. Skin: Warm and dry. No rashes or lesions. Extremities: No clubbing or cyanosis. No edema.   Discharge Instructions   Discharge Instructions    Call MD for:  difficulty breathing, headache or visual disturbances   Complete by: As directed    Call MD for:  extreme fatigue   Complete by: As directed    Call MD for:  hives   Complete by: As directed    Call MD for:  persistant dizziness or light-headedness   Complete by: As directed    Call MD for:  persistant nausea and vomiting   Complete by: As directed    Call MD for:  redness, tenderness, or signs of infection (pain, swelling, redness, odor or green/yellow discharge around incision site)   Complete by: As directed    Call MD for:  severe uncontrolled pain   Complete by: As directed    Call MD for:  temperature >100.4   Complete by: As directed    Diet - low sodium heart healthy   Complete by: As directed    Discharge instructions   Complete by: As directed    You were seen for DKA and Sedric Guia covid infection.  Your marijuana use may be playing [name] significant role in your recurrent nausea and vomiting.  Cessation/abstinence will be important to help prevent recurrent nausea and vomiting and recurrent DKA.  Please follow up with gastroenterology as scheduled.    We've adjusted your insulin to 12 units of lantus twice daily.  Continue this twice daily regimen when you  get home.  Continue your aspart sliding scale as prescribed.  We'll try to give you resources for  Antolin PCP, following up with Eldean Nanna PCP will be important to make adjustments to your regimen in the future.  You have covid 19.  You'll continue remdesivir as an outpatient.  You'll need to quarantine for 10 days from your positive test.  As long as you remain asymptomatic from Marlia Schewe respiratory standpoint without fevers, etc, your quarantine can be discontinued after 12/07/2019.  Please ask your PCP or the heath department if you have questions.  You have blood in your urine from the foley trauma.  Please follow up your blood in your urine as an outpatient with your PCP (we'll provide resources as noted above).  Once your urine is yellow again (without red blood), you should get Zissel Biederman urinalysis to make sure the microscopic blood clears as well.  Your confusion was related to your DKA.  I think the sedating medicines we gave you also contributed.  I think you should improve as you return home.  If you have persistent symptoms, imaging should be considered.  Return for new, recurrent, or worsening symptoms.  Please ask your PCP to request records from this hospitalization so they know what was done and what the next steps will be.   Increase activity slowly   Complete by: As directed      Allergies as of 11/30/2019   No Known Allergies     Medication List    TAKE these medications   insulin glargine 100 UNIT/ML injection Commonly known as: LANTUS Inject 0.12 mLs (12 Units total) into the skin 2 (two) times daily. What changed:   how much to take  when to take this   NovoLOG FlexPen 100 UNIT/ML FlexPen Generic drug: insulin aspart Inject into the skin 3 (three) times daily with meals. Per Sliding Scale      No Known Allergies    The results of significant diagnostics from this hospitalization (including imaging, microbiology, ancillary and laboratory) are listed below for reference.     Significant Diagnostic Studies: EEG  Result Date: 11/28/2019 Charlsie QuestYadav, Priyanka O, MD     11/28/2019 10:17 PM Patient Name: Dalton CuriaCody Schlueter MRN: [id number] Epilepsy Attending: Charlsie QuestPriyanka O Yadav Referring Physician/Provider: Dr Cheri FowlerSudham Chand Date: 11/28/2019 Duration: 21.26 mins Patient history: 24 y.o. M with PMH of poorly controlled Type 1 DM who presented with confusion and AMS.  EEG to evaluate for seizure Level of alertness: Awake AEDs during EEG study: None Technical aspects: This EEG study was done with scalp electrodes positioned according to the 10-20 International system of electrode placement. Electrical activity was acquired at Kayman Snuffer sampling rate of 500Hz  and reviewed with Revin Corker high frequency filter of 70Hz  and Smantha Boakye low frequency filter of 1Hz . EEG data were recorded continuously and digitally stored. Description: No posterior dominant rhythm was seen. EEG showed continuous generalized 2-3Hz  high amplitude delta slowing. Hyperventilation and photic stimulation were not performed.   ABNORMALITY -Continuous slow, generalized IMPRESSION: This study is suggestive of moderate to severe diffuse encephalopathy, nonspecific etiology. No seizures or epileptiform discharges were seen throughout the recording. Charlsie Questriyanka O Yadav   CT Head Wo Contrast  Result Date: 11/27/2019 CLINICAL DATA:  Slurred speech. EXAM: CT HEAD WITHOUT CONTRAST TECHNIQUE: Contiguous axial images were obtained from the base of the skull through the vertex without intravenous contrast. COMPARISON:  None. FINDINGS: Brain: No evidence of acute infarction, hemorrhage, hydrocephalus, extra-axial collection or mass lesion/mass effect. Vascular: No hyperdense vessel or unexpected calcification. Skull: Normal. Negative for fracture or focal lesion. Sinuses/Orbits: Very mild bilateral ethmoid sinus mucosal thickening is seen. Other: None. IMPRESSION: 1. No acute intracranial abnormality. 2. Very mild bilateral ethmoid sinus mucosal thickening. Electronically  Signed   By: Aram Candelahaddeus  Houston M.D.   On: 11/27/2019 22:10   DG Chest Portable 1 View  Result Date: 11/27/2019 CLINICAL DATA:  Slurred speech. EXAM: PORTABLE CHEST 1 VIEW COMPARISON:  None. FINDINGS: There is no evidence of acute infiltrate, pleural effusion or pneumothorax. The heart size and mediastinal contours are within normal limits. The visualized skeletal structures are unremarkable. IMPRESSION: No active cardiopulmonary disease. Electronically Signed   By: Aram Candelahaddeus  Houston M.D.   On: 11/27/2019 22:08   Microbiology: Recent Results (from the past 240 hour(s))  SARS Coronavirus 2 by RT PCR (hospital order, performed in [facility] hospital lab) Nasopharyngeal     Status: Abnormal   Collection Time: 11/27/19 10:12 PM   Specimen: Nasopharyngeal  Result Value Ref Range Status   SARS Coronavirus 2 POSITIVE (Alka Falwell) NEGATIVE Final    Comment: RESULT CALLED TO, READ BACK BY AND VERIFIED WITH:  MAYNARD,C, RN @ [phone] 11/27/19 BY GWYN,P (NOTE) SARS-CoV-2 target nucleic acids are DETECTED  SARS-CoV-2 RNA is generally detectable in upper respiratory specimens  during the acute phase of infection.  Positive results are indicative  of the presence of the identified virus, but do not rule out bacterial infection or co-infection with other pathogens not detected by the test.  Clinical correlation with patient history and  other diagnostic information is necessary to determine patient infection status.  The expected result is negative.  Fact Sheet for Patients:   BoilerBrush.com.cy   Fact Sheet for Healthcare Providers:   https://pope.com/    This test is not yet approved or cleared by the [place] FDA and  has been authorized for detection and/or diagnosis of SARS-CoV-2 by FDA under an Emergency Use Authorization (EUA).  This EUA will remain in effect (meaning thi s test can be used) for the duration of  the COVID-19 declaration under Section  564(b)(1) of the Act, 21 U.S.C. section 360-bbb-3(b)(1), unless the authorization is terminated or revoked sooner.  Performed at [facility], [address] [place], [place] [address]   Culture, blood (routine x 2)     Status: None (Preliminary result)   Collection Time: 11/27/19 10:12 PM   Specimen: BLOOD RIGHT ARM  Result Value Ref Range Status   Specimen Description   Final    BLOOD RIGHT ARM Performed at [facility], [address] [place], [place] [address]    Special Requests   Final    BOTTLES DRAWN AEROBIC AND ANAEROBIC Blood Culture results may not be optimal due to an excessive volume of blood received in culture bottles Performed at [facility], [address] [place], [place] [address]    Culture   Final    NO GROWTH 2 DAYS Performed at [facility], [address], [place], [place] [address]    Report Status PENDING  Incomplete  Culture, blood (routine x 2)     Status: None (Preliminary result)   Collection Time: 11/27/19 11:00 PM   Specimen: BLOOD LEFT HAND  Result Value Ref Range Status   Specimen Description   Final    BLOOD LEFT HAND Performed at [facility], [address] [place], [place] [address]    Special Requests   Final    BOTTLES DRAWN AEROBIC ONLY Blood Culture results may not be optimal due to an inadequate volume of blood received in culture bottles Performed at [facility], [address] [place], [place] [address]    Culture   Final    NO GROWTH 2 DAYS Performed at [facility], [address], [place], [place] [address]    Report Status PENDING  Incomplete  MRSA PCR Screening     Status: None   Collection Time: 11/28/19  4:54 AM   Specimen: Nasal Mucosa; Nasopharyngeal  Result Value Ref Range Status   MRSA by PCR NEGATIVE NEGATIVE Final    Comment:        The GeneXpert MRSA Assay (FDA approved for NASAL specimens only), is one component of Deeanna Beightol comprehensive MRSA  colonization surveillance program. It is not intended to diagnose MRSA infection nor to guide or monitor treatment for MRSA infections. Performed at [facility], [address], [place], [place] [address]      Labs: Basic Metabolic Panel: Recent Labs  Lab 11/28/19 [phone] 11/28/19 1235 11/29/19 0549 11/30/19  0238 11/30/19 1111  NA 135 151* 158* 142 143  K 3.9 4.0 3.6 3.4* 4.0  CL 97* 110 117* 101 99  CO2 15* 28 32 31 33*  GLUCOSE 1,144* 201* 68* 329* 294*  BUN 79* 64* 44* 14 12  CREATININE 4.79* 3.31* 1.86* 0.98 1.17  CALCIUM 7.0* 7.4* 8.5* 8.5* 8.8*  MG  --   --   --  1.9 2.3  PHOS  --   --   --  1.4*  --    Liver Function Tests: Recent Labs  Lab 11/27/19 2141 11/30/19 1111  AST 39 83*  ALT 34 68*  ALKPHOS 134* 95  BILITOT 1.7* 0.9  PROT 6.3* 5.9*  ALBUMIN 4.1 3.3*   Recent Labs  Lab 11/27/19 2141  LIPASE 20   No results for input(s): AMMONIA in the last 168 hours. CBC: Recent Labs  Lab 11/27/19 2141 11/27/19 2150 11/30/19 1111  WBC 30.9*  --  10.8*  NEUTROABS 26.9*  --  7.7  HGB 14.1 15.6 14.0  HCT 50.2 46.0 41.1  MCV 107.7*  --  90.1  PLT 574*  --  176   Cardiac Enzymes: No results for input(s): CKTOTAL, CKMB, CKMBINDEX, TROPONINI in the last 168 hours. BNP: BNP (last 3 results) No results for input(s): BNP in the last 8760 hours.  ProBNP (last 3 results) No results for input(s): PROBNP in the last 8760 hours.  CBG: Recent Labs  Lab 11/29/19 1800 11/29/19 2019 11/30/19 0253 11/30/19 0751 11/30/19 1228  GLUCAP 161* 169* 289* 283* 291*       Signed:  Lacretia Nicks MD.  [facility] 11/30/2019, 1:10 PM

## 2019-11-30 NOTE — TOC Initial Note (Signed)
Transition of Care [facility]) - Initial/Assessment Note    Patient Details  Name: Dalton Harrison MRN: [id number] Date of Birth: 13-Jul-1995  [facility] CM/SW Contact:    Beckie Busing, RN Phone Number: [phone]  11/30/2019, 2:00 PM  Clinical Narrative:  [facility] consulted for patient admitted with DKA. Patient has no insurance and states that his mother provides his medications. MD requested patient be set up with PCP. Patient states that he has an appointment in October at [facility]. Patient does not want to be set up with a PCP, he states that he wants to keep his current appointment and has no intentions on switching to another PCP.               Expected Discharge Plan: Home/Self Care Barriers to Discharge: No Barriers Identified   Patient Goals and CMS Choice Patient states their goals for this hospitalization and ongoing recovery are:: Ready to go home   Choice offered to / list presented to : NA  Expected Discharge Plan and Services Expected Discharge Plan: Home/Self Care In-house Referral: NA Discharge Planning Services: CM Consult Post Acute Care Choice: NA Living arrangements for the past 2 months: Single Family Home Expected Discharge Date: 11/30/19               DME Arranged: N/A DME Agency: NA       HH Arranged: NA HH Agency: NA        Prior Living Arrangements/Services Living arrangements for the past 2 months: Single Family Home Lives with:: Parents Patient language and need for interpreter reviewed:: Yes Do you feel safe going back to the place where you live?: Yes      Need for Family Participation in Patient Care: Yes (Comment) Care giver support system in place?: Yes (comment)   Criminal Activity/Legal Involvement Pertinent to Current Situation/Hospitalization: No - Comment as needed  Activities of Daily Living      Permission Sought/Granted   Permission granted to share information with : No               Emotional Assessment Appearance:: Other (Comment Required (COVID patient telephone encounter) Attitude/Demeanor/Rapport: Gracious Affect (typically observed): Unable to Assess Orientation: : Oriented to Self, Oriented to Place, Oriented to  Time, Oriented to Situation Alcohol / Substance Use: Not Applicable Psych Involvement: No (comment)  Admission diagnosis:  Hyperkalemia [E87.5] DKA (diabetic ketoacidoses) (HCC) [E11.10] AKI (acute kidney injury) (HCC) [N17.9] Diabetic ketoacidosis without coma associated with type 1 diabetes mellitus (HCC) [E10.10] Patient Active Problem List   Diagnosis Date Noted  . DKA (diabetic ketoacidoses) (HCC) 11/27/2019   PCP:  Gwynneth Macleod, MD Pharmacy:   [facility] [place], [place] - [address] [place] [address] Phone: [phone] Fax: [phone]     Social Determinants of Health (SDOH) Interventions    Readmission Risk Interventions No flowsheet data found.

## 2019-12-01 ENCOUNTER — Ambulatory Visit (HOSPITAL_COMMUNITY)
Admit: 2019-12-01 | Discharge: 2019-12-01 | Disposition: A | Discharge status: Home / Self Care | Payer: HRSA Program | Source: Ambulatory Visit | Attending: Pulmonary Disease | Admitting: Pulmonary Disease

## 2019-12-01 DIAGNOSIS — U071 COVID-19: Secondary | ICD-10-CM | POA: Diagnosis present

## 2019-12-01 MED ORDER — FAMOTIDINE IN NACL 20-0.9 MG/50ML-% IV SOLN: 20.0000 mg | Freq: Once | INTRAVENOUS | Status: DC | PRN

## 2019-12-01 MED ORDER — DIPHENHYDRAMINE HCL 50 MG/ML IJ SOLN: 50.0000 mg | Freq: Once | INTRAMUSCULAR | Status: DC | PRN

## 2019-12-01 MED ORDER — SODIUM CHLORIDE 0.9 % IV SOLN: INTRAVENOUS | Status: DC | PRN

## 2019-12-01 MED ORDER — EPINEPHRINE 0.3 MG/0.3ML IJ SOAJ: 0.3000 mg | Freq: Once | INTRAMUSCULAR | Status: DC | PRN

## 2019-12-01 MED ORDER — ALBUTEROL SULFATE HFA 108 (90 BASE) MCG/ACT IN AERS: 2.0000 | INHALATION_SPRAY | Freq: Once | RESPIRATORY_TRACT | Status: DC | PRN

## 2019-12-01 MED ORDER — METHYLPREDNISOLONE SODIUM SUCC 125 MG IJ SOLR: 125.0000 mg | Freq: Once | INTRAMUSCULAR | Status: DC | PRN

## 2019-12-01 MED ORDER — SODIUM CHLORIDE 0.9 % IV SOLN
100.0000 mg | Freq: Once | INTRAVENOUS | Status: AC
  Administered 2019-12-01: 100 mg via INTRAVENOUS
  Filled 2019-12-01: qty 20

## 2019-12-01 NOTE — Discharge Instructions (Signed)
10 Things You Can Do to Manage Your COVID-19 Symptoms at Home If you have possible or confirmed COVID-19: 1. Stay home from work and school. And stay away from other public places. If you must go out, avoid using any kind of public transportation, ridesharing, or taxis. 2. Monitor your symptoms carefully. If your symptoms get worse, call your healthcare provider immediately. 3. Get rest and stay hydrated. 4. If you have a medical appointment, call the healthcare provider ahead of time and tell them that you have or may have COVID-19. 5. For medical emergencies, call 911 and notify the dispatch personnel that you have or may have COVID-19. 6. Cover your cough and sneezes with a tissue or use the inside of your elbow. 7. Wash your hands often with soap and water for at least 20 seconds or clean your hands with an alcohol-based hand sanitizer that contains at least 60% alcohol. 8. As much as possible, stay in a specific room and away from other people in your home. Also, you should use a separate bathroom, if available. If you need to be around other people in or outside of the home, wear a mask. 9. Avoid sharing personal items with other people in your household, like dishes, towels, and bedding. 10. Clean all surfaces that are touched often, like counters, tabletops, and doorknobs. Use household cleaning sprays or wipes according to the label instructions. cdc.gov/coronavirus 09/08/2018 This information is not intended to replace advice given to you by your health care provider. Make sure you discuss any questions you have with your health care provider. Document Revised: 02/10/2019 Document Reviewed: 02/10/2019 Elsevier Patient Education  2020 Elsevier Inc.  

## 2019-12-01 NOTE — Progress Notes (Signed)
  Diagnosis: COVID-19  Physician: Shan Levans, MD  Procedure: [facility]id Infusion Clinic Med: remdesivir infusion - Provided patient with remdesivir fact sheet for patients, parents and caregivers prior to infusion.  Complications: No immediate complications noted.  Discharge: Discharged home   Angelia Mould 12/01/2019

## 2019-12-02 ENCOUNTER — Ambulatory Visit (HOSPITAL_COMMUNITY)
Admission: RE | Admit: 2019-12-02 | Discharge: 2019-12-02 | Disposition: A | Discharge status: Home / Self Care | Payer: HRSA Program | Source: Ambulatory Visit | Attending: Pulmonary Disease | Admitting: Pulmonary Disease

## 2019-12-02 DIAGNOSIS — U071 COVID-19: Secondary | ICD-10-CM | POA: Insufficient documentation

## 2019-12-02 DIAGNOSIS — J1282 Pneumonia due to coronavirus disease 2019: Secondary | ICD-10-CM | POA: Insufficient documentation

## 2019-12-02 MED ORDER — EPINEPHRINE 0.3 MG/0.3ML IJ SOAJ: 0.3000 mg | Freq: Once | INTRAMUSCULAR | Status: DC | PRN

## 2019-12-02 MED ORDER — METHYLPREDNISOLONE SODIUM SUCC 125 MG IJ SOLR: 125.0000 mg | Freq: Once | INTRAMUSCULAR | Status: DC | PRN

## 2019-12-02 MED ORDER — SODIUM CHLORIDE 0.9 % IV SOLN: INTRAVENOUS | Status: DC | PRN

## 2019-12-02 MED ORDER — ALBUTEROL SULFATE HFA 108 (90 BASE) MCG/ACT IN AERS: 2.0000 | INHALATION_SPRAY | Freq: Once | RESPIRATORY_TRACT | Status: DC | PRN

## 2019-12-02 MED ORDER — DIPHENHYDRAMINE HCL 50 MG/ML IJ SOLN: 50.0000 mg | Freq: Once | INTRAMUSCULAR | Status: DC | PRN

## 2019-12-02 MED ORDER — FAMOTIDINE IN NACL 20-0.9 MG/50ML-% IV SOLN: 20.0000 mg | Freq: Once | INTRAVENOUS | Status: DC | PRN

## 2019-12-02 MED ORDER — SODIUM CHLORIDE 0.9 % IV SOLN
100.0000 mg | Freq: Once | INTRAVENOUS | Status: AC
  Administered 2019-12-02: 100 mg via INTRAVENOUS
  Filled 2019-12-02: qty 20

## 2019-12-02 NOTE — Progress Notes (Signed)
  Diagnosis: COVID-19  Physician:DR. Delford Field  Procedure: Covid Infusion Clinic Med: bamlanivimab\etesevimab infusion - Provided patient with bamlanimivab\etesevimab fact sheet for patients, parents and caregivers prior to infusion.  Complications: No immediate complications noted.  Discharge: Discharged home   Robb Matar 12/02/2019

## 2019-12-03 LAB — CULTURE, BLOOD (ROUTINE X 2)
Culture: NO GROWTH
Culture: NO GROWTH

## 2021-04-05 IMAGING — CT CT HEAD W/O CM
3 series · 15 of 47 positions shown, 18 images · non-contrast
Comparison: None.

CLINICAL DATA: Slurred speech.

EXAM:
CT HEAD WITHOUT CONTRAST
TECHNIQUE: Contiguous axial images were obtained from the base of the skull
through the vertex without intravenous contrast.

[Series 2: head wo · axial · 0.47mm/px · z∈[+777,+912]mm · 9 of 33 slices shown, 12 images]
[im 3/33  brain]
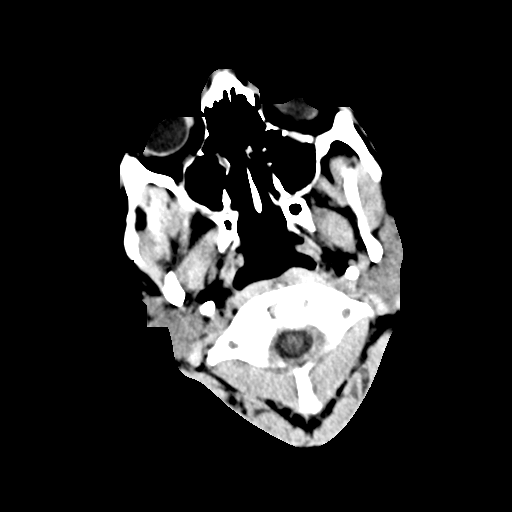
[im 3/33  bone]
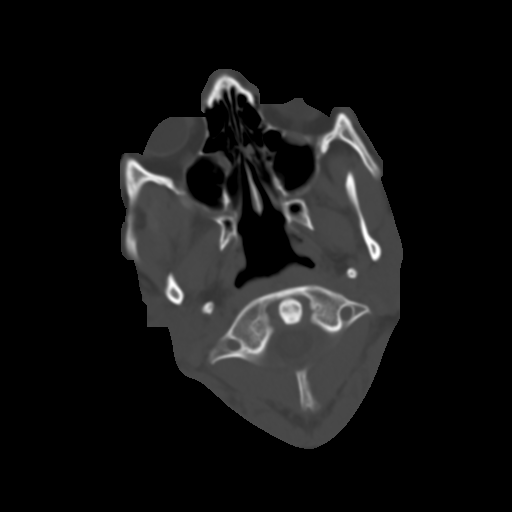
[im 6/33  brain]
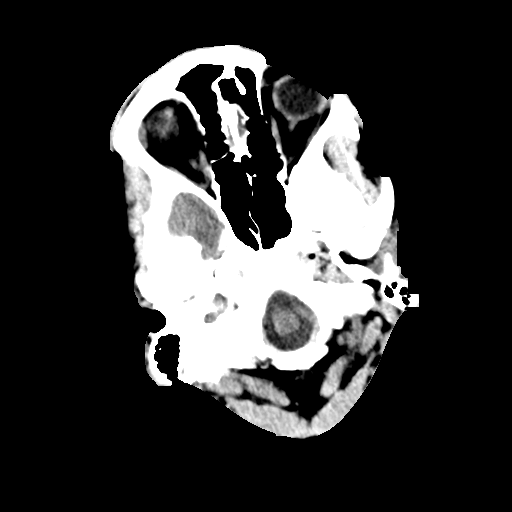
[im 9/33  brain]
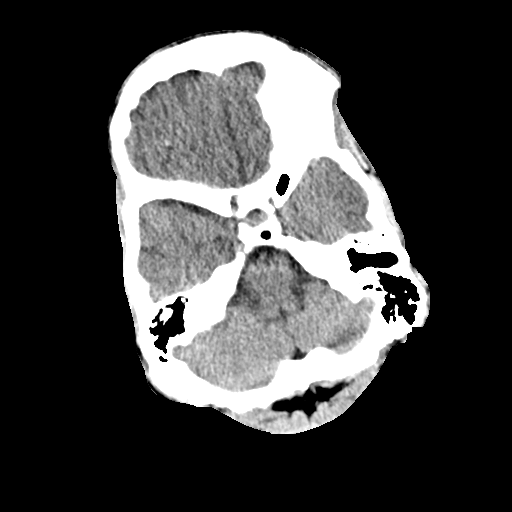
[im 13/33  brain]
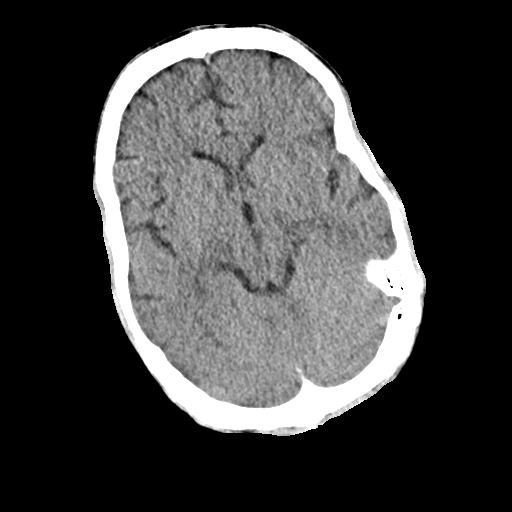
[im 17/33  brain]
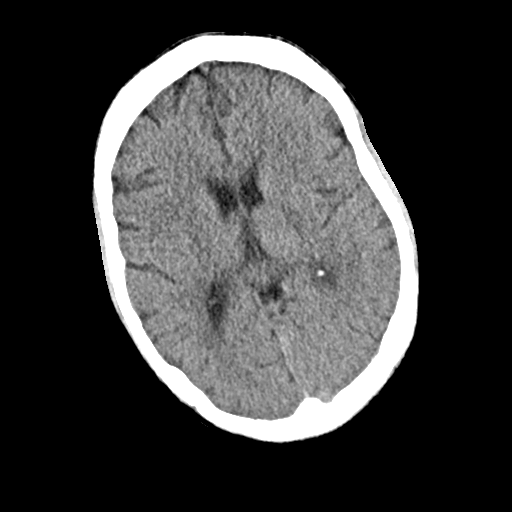
[im 17/33  bone]
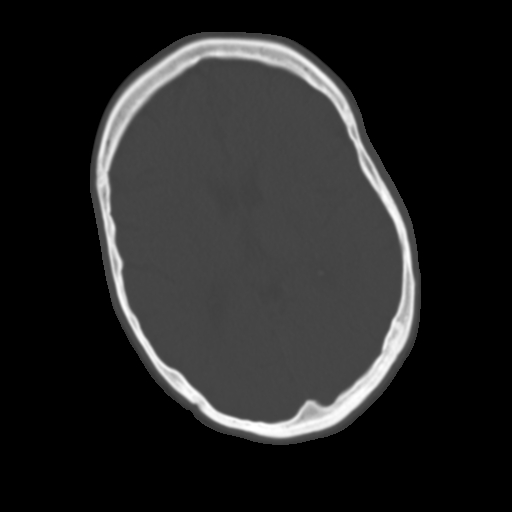
[im 20/33  brain]
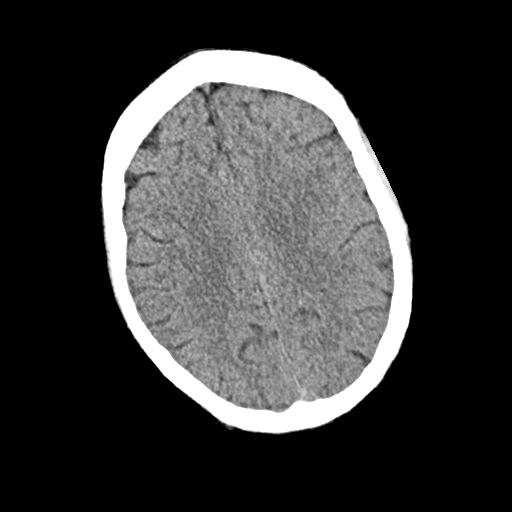
[im 24/33  brain]
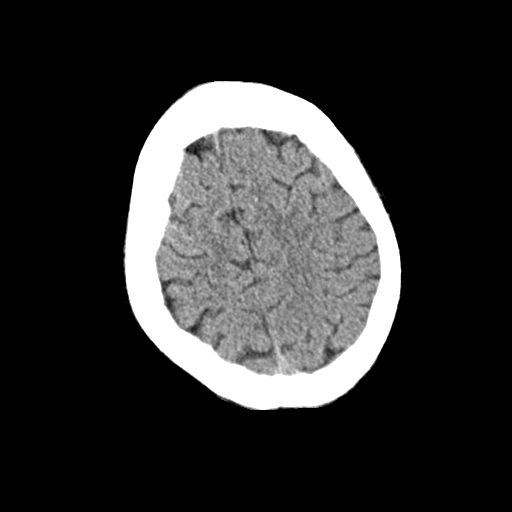
[im 27/33  brain]
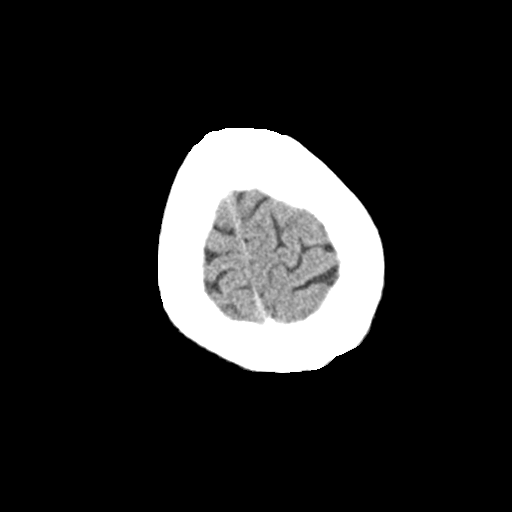
[im 30/33  brain]
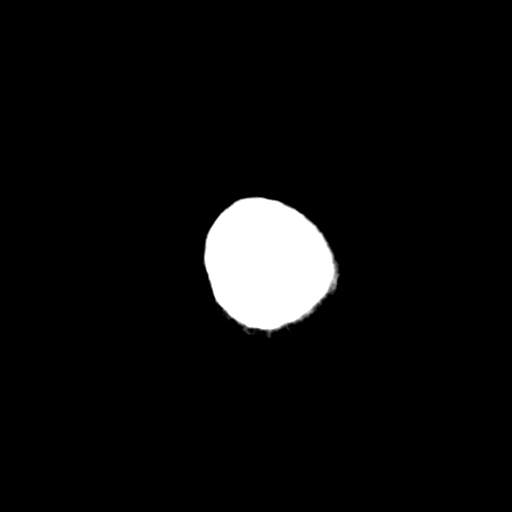
[im 30/33  bone]
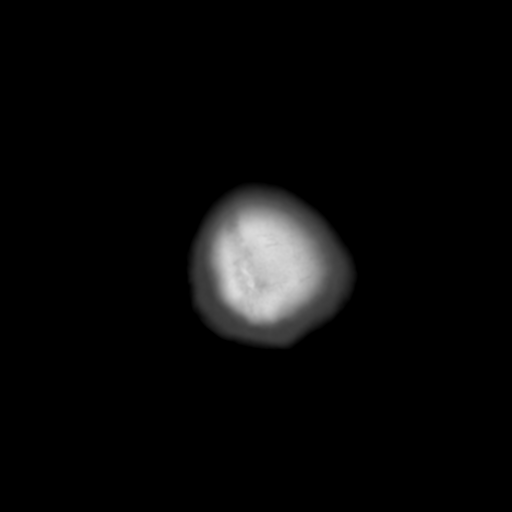

[Series 4: coronal soft · coronal · 0.33mm/px · 3 of 75 slices shown]
[im 25/75  brain]
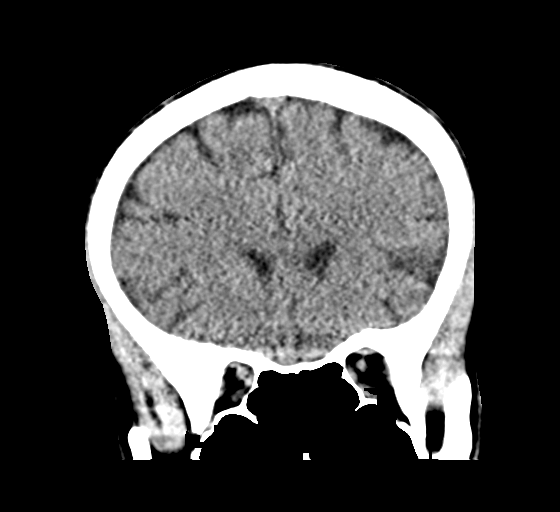
[im 33/75  brain]
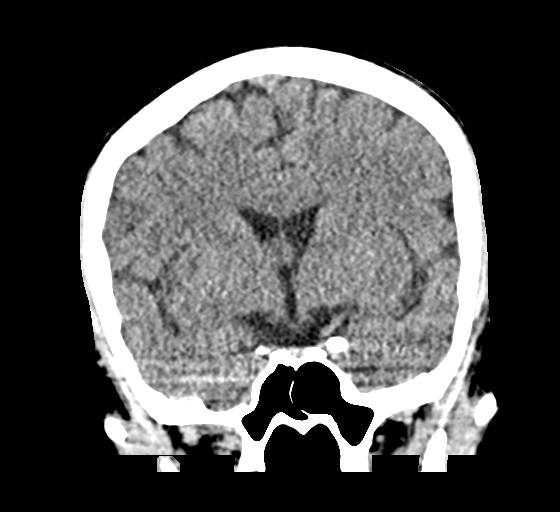
[im 42/75  brain]
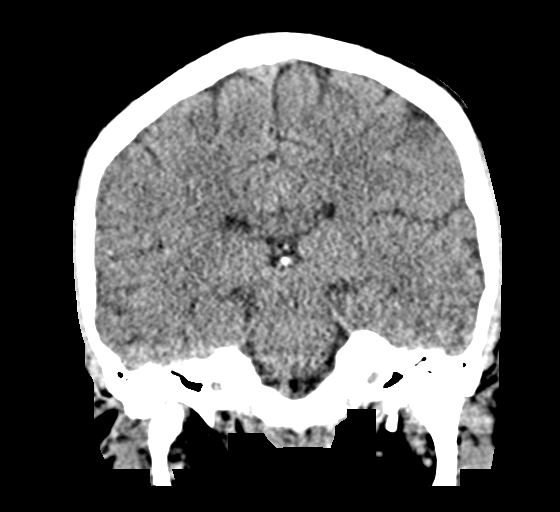

[Series 5: sag soft · sagittal · 0.32mm/px · 3 of 55 slices shown]
[im 19/55  brain]
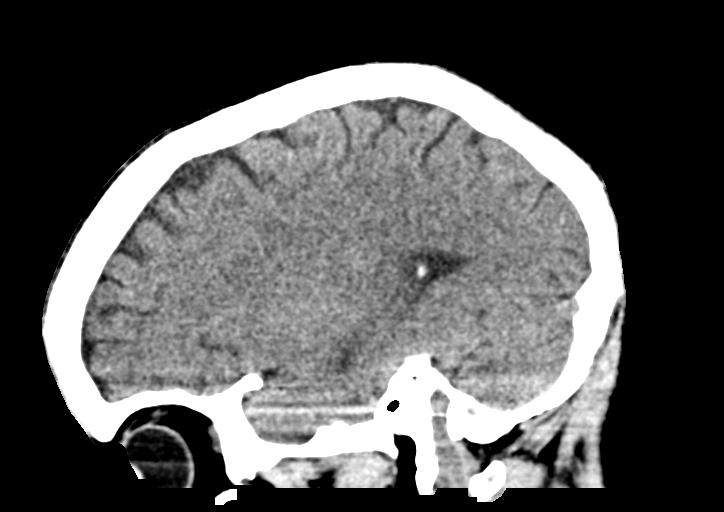
[im 28/55  brain]
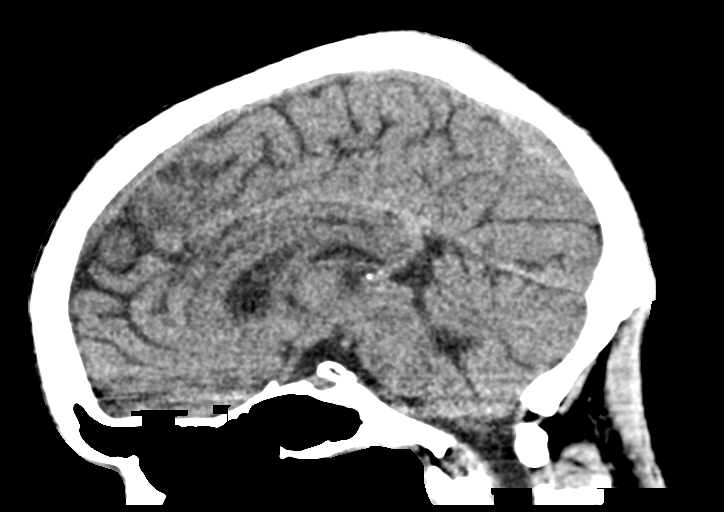
[im 37/55  brain]
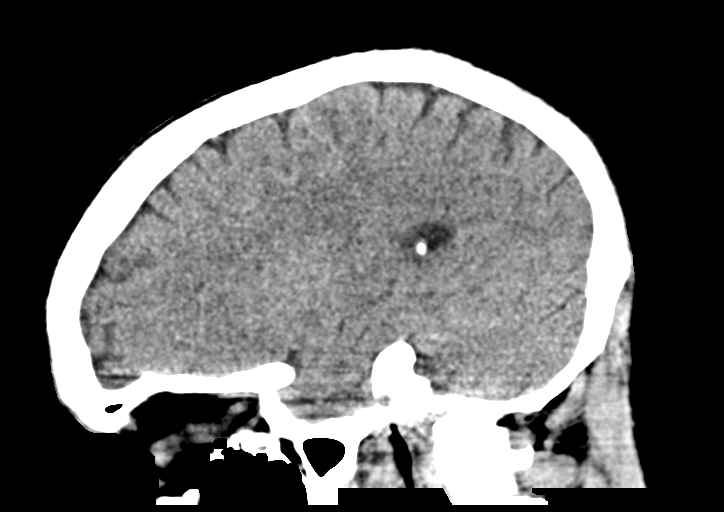

[15 of 47 positions shown; findings below may reference images not displayed]

FINDINGS: Brain: No evidence of acute infarction, hemorrhage, hydrocephalus,
extra-axial collection or mass lesion/mass effect.

Vascular: No hyperdense vessel or unexpected calcification.

Skull: Normal. Negative for fracture or focal lesion.

Sinuses/Orbits: Very mild bilateral ethmoid sinus mucosal thickening
is seen.

Other: None.
IMPRESSION: 1. No acute intracranial abnormality.
2. Very mild bilateral ethmoid sinus mucosal thickening.

## 2024-04-10 DEATH — deceased
